# Patient Record
Sex: Male | Born: 1990 | Race: Black or African American | Hispanic: No | Marital: Single
Health system: Southern US, Community
[De-identification: ages and names within clinical notes are randomized; demographics above are authoritative.]

## PROBLEM LIST (undated history)

## (undated) DIAGNOSIS — F23 Brief psychotic disorder: Secondary | ICD-10-CM

## (undated) DIAGNOSIS — F209 Schizophrenia, unspecified: Secondary | ICD-10-CM

## (undated) DIAGNOSIS — F319 Bipolar disorder, unspecified: Secondary | ICD-10-CM

---
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
## 2012-01-21 ENCOUNTER — Emergency Department (HOSPITAL_COMMUNITY)
Admission: EM | Admit: 2012-01-21 | Discharge: 2012-01-21 | Disposition: A | Discharge status: Home / Self Care | Payer: Self-pay | Attending: Emergency Medicine | Admitting: Emergency Medicine

## 2012-01-21 ENCOUNTER — Emergency Department (HOSPITAL_COMMUNITY): Payer: Self-pay

## 2012-01-21 ENCOUNTER — Encounter (HOSPITAL_COMMUNITY): Payer: Self-pay | Admitting: *Deleted

## 2012-01-21 DIAGNOSIS — S0510XA Contusion of eyeball and orbital tissues, unspecified eye, initial encounter: Secondary | ICD-10-CM

## 2012-01-21 DIAGNOSIS — F17200 Nicotine dependence, unspecified, uncomplicated: Secondary | ICD-10-CM | POA: Insufficient documentation

## 2012-01-21 MED ORDER — IBUPROFEN 600 MG PO TABS: 600.0000 mg | ORAL_TABLET | Freq: Four times a day (QID) | ORAL | Status: AC | PRN

## 2012-01-21 NOTE — ED Notes (Signed)
Ice bag given.

## 2012-01-21 NOTE — ED Provider Notes (Signed)
Medical screening examination/treatment/procedure(s) were performed by non-physician practitioner and as supervising physician I was immediately available for consultation/collaboration.  Carlean Crowl, MD 01/21/12 0533 
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
## 2013-02-11 ENCOUNTER — Emergency Department (HOSPITAL_COMMUNITY)
Admission: EM | Admit: 2013-02-11 | Discharge: 2013-02-13 | Disposition: A | Discharge status: Other Acute Inpatient Hospital | Payer: Self-pay | Attending: Emergency Medicine | Admitting: Emergency Medicine

## 2013-02-11 ENCOUNTER — Encounter (HOSPITAL_COMMUNITY): Payer: Self-pay

## 2013-02-11 DIAGNOSIS — I82220 Acute embolism and thrombosis of inferior vena cava: Secondary | ICD-10-CM | POA: Insufficient documentation

## 2013-02-11 DIAGNOSIS — IMO0002 Reserved for concepts with insufficient information to code with codable children: Secondary | ICD-10-CM | POA: Insufficient documentation

## 2013-02-11 DIAGNOSIS — R443 Hallucinations, unspecified: Secondary | ICD-10-CM | POA: Insufficient documentation

## 2013-02-11 DIAGNOSIS — F17200 Nicotine dependence, unspecified, uncomplicated: Secondary | ICD-10-CM | POA: Insufficient documentation

## 2013-02-11 DIAGNOSIS — F201 Disorganized schizophrenia: Secondary | ICD-10-CM | POA: Insufficient documentation

## 2013-02-11 DIAGNOSIS — F29 Unspecified psychosis not due to a substance or known physiological condition: Secondary | ICD-10-CM | POA: Insufficient documentation

## 2013-02-11 LAB — COMPREHENSIVE METABOLIC PANEL
AST: 19 U/L (ref 0–37)
Albumin: 4.4 g/dL (ref 3.5–5.2)
Alkaline Phosphatase: 47 U/L (ref 39–117)
BUN: 9 mg/dL (ref 6–23)
CO2: 27 mEq/L (ref 19–32)
Chloride: 101 mEq/L (ref 96–112)
Potassium: 3.5 mEq/L (ref 3.5–5.1)
Total Bilirubin: 0.2 mg/dL — ABNORMAL LOW (ref 0.3–1.2)

## 2013-02-11 LAB — CBC WITH DIFFERENTIAL/PLATELET
Basophils Absolute: 0 10*3/uL (ref 0.0–0.1)
Basophils Relative: 0 % (ref 0–1)
Hemoglobin: 12.6 g/dL — ABNORMAL LOW (ref 13.0–17.0)
Lymphocytes Relative: 30 % (ref 12–46)
MCHC: 31.7 g/dL (ref 30.0–36.0)
Monocytes Relative: 9 % (ref 3–12)
Neutro Abs: 3.4 10*3/uL (ref 1.7–7.7)
Neutrophils Relative %: 60 % (ref 43–77)
WBC: 5.6 10*3/uL (ref 4.0–10.5)

## 2013-02-11 LAB — RAPID URINE DRUG SCREEN, HOSP PERFORMED
Barbiturates: NOT DETECTED
Tetrahydrocannabinol: POSITIVE — AB

## 2013-02-11 LAB — SALICYLATE LEVEL: Salicylate Lvl: <2 mg/dL — ABNORMAL LOW (ref 2.8–20.0)

## 2013-02-11 MED ORDER — ACETAMINOPHEN 325 MG PO TABS: 650.0000 mg | ORAL_TABLET | ORAL | Status: DC | PRN

## 2013-02-11 MED ORDER — LORAZEPAM 2 MG/ML IJ SOLN
2.0000 mg | Freq: Once | INTRAMUSCULAR | Status: DC
  Filled 2013-02-11: qty 1

## 2013-02-11 MED ORDER — ONDANSETRON HCL 4 MG PO TABS: 4.0000 mg | ORAL_TABLET | Freq: Three times a day (TID) | ORAL | Status: DC | PRN

## 2013-02-11 MED ORDER — LORAZEPAM 1 MG PO TABS: 1.0000 mg | ORAL_TABLET | Freq: Three times a day (TID) | ORAL | Status: DC | PRN

## 2013-02-11 MED ORDER — IBUPROFEN 200 MG PO TABS: 600.0000 mg | ORAL_TABLET | Freq: Three times a day (TID) | ORAL | Status: DC | PRN

## 2013-02-11 MED ORDER — LORAZEPAM 1 MG PO TABS
2.0000 mg | ORAL_TABLET | Freq: Once | ORAL | Status: DC
  Filled 2013-02-11: qty 2

## 2013-02-11 NOTE — ED Provider Notes (Signed)
CSN: [id number]     Arrival date & time 02/11/13  1635 History  This chart was scribed for non-physician practitioner Teressa Lower, FNP, working with Derwood Kaplan, MD, by Yevette Edwards, ED Scribe. This patient was seen in room [facility] and the patient's care was started at 5:10 PM.    First MD Initiated Contact with Patient 02/11/13 1645     Chief Complaint  Patient presents with  . IVC   . Medical Clearance    The history is provided by the patient, the police and a parent. No language interpreter was used.   HPI Comments: Mario [name] is a 22 y.o. male who presents to the Emergency Department due to involuntary commitment. The police report the pt had allegedly tried to OD on pills and had been hearing voices and conversing with them. However, at the ED, the pt denies both the pills and the voices. The pt's mother reports that the pt routinely attempts to OD on pills. The pt reports that he has not spoken with his mother in two weeks, and he expresses confusion about why and how his mother took out IVC papers on him. The pt denies being diagnosed with any psychological issues, behavioral issues, or any prior treatment by a behavioral health professional. He also denies attempting to harm himself or wanting to harm himself or others.   History reviewed. No pertinent past medical history. History reviewed. No pertinent past surgical history. No family history on file. History  Substance Use Topics  . Smoking status: Current Every Day Smoker -- 0.50 packs/day  . Smokeless tobacco: Not on file  . Alcohol Use: No     Comment: occasionally    Review of Systems  Constitutional: Negative for fever and chills.  Psychiatric/Behavioral: Positive for hallucinations (Per the pt's mother), behavioral problems (Per the pt's mother) and self-injury (Per the pt's mother).  All other systems reviewed and are negative.    Allergies  Review of patient's allergies indicates no known  allergies.  Home Medications  No current outpatient prescriptions on file.  Triage Vitals: BP 160/91  Pulse 112  Temp(Src) 98.4 F (36.9 C) (Oral)  Resp 18  SpO2 100%  Physical Exam  Nursing note and vitals reviewed. Constitutional: He is oriented to person, place, and time. He appears well-developed and well-nourished. No distress.  HENT:  Head: Normocephalic and atraumatic.  Eyes: EOM are normal.  Neck: Neck supple. No tracheal deviation present.  Cardiovascular: Normal rate.   Pulmonary/Chest: Effort normal. No respiratory distress.  Musculoskeletal: Normal range of motion.  Neurological: He is alert and oriented to person, place, and time.  Skin: Skin is warm and dry.  Psychiatric: He has a normal mood and affect. His behavior is normal.    ED Course   DIAGNOSTIC STUDIES: Oxygen Saturation is 100% on room air, normal by my interpretation.    COORDINATION OF CARE:  5:14 PM- Discussed treatment plan with patient, and the patient agreed to the plan.   Procedures (including critical care time)  Labs Reviewed  CBC WITH DIFFERENTIAL - Abnormal; Notable for the following:    Hemoglobin 12.6 (*)    All other components within normal limits  COMPREHENSIVE METABOLIC PANEL  URINE RAPID DRUG SCREEN (HOSP PERFORMED)  ACETAMINOPHEN LEVEL  SALICYLATE LEVEL   No results found. No diagnosis found.  MDM  Pt to be sent to psych ed for evaluation  I personally performed the services described in this documentation, which was scribed in my presence. The  recorded information has been reviewed and is accurate.    Teressa Lower, NP 02/11/13 2001

## 2013-02-11 NOTE — ED Notes (Signed)
Pt refused dinner tray

## 2013-02-11 NOTE — ED Notes (Signed)
Pt gave urine, greg at bedside assessing patient now

## 2013-02-11 NOTE — ED Notes (Addendum)
Notified Dondra Spry, NP. About pt behavior, agitation, not redirectable,  and noncompliant  NP request pt urune to further assess medical and psych needs.  New orders given by NP.  Pt refuses to give urine.  AC, [place] at nurses station talking with patient.  Will continue to monitor.
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
## 2013-02-11 NOTE — ED Notes (Signed)
Pt brought in by [employer] under IVC paperwork stating pt had tried OD on pills, but doesnt state when pt tried; pt hearing voices and talking back to them; and pt is threat to himself.  When triaging pt pt states he doesn't know why he is here bc the person who is doing all this calling he hasnt seen or spoken to in couple of weeks. Pt denying taking in any pills or any thoughts of wanting to harm himself or others at this time.

## 2013-02-11 NOTE — ED Notes (Signed)
Pt refuses to stay away from nurses station while in report.  Pt continues to ask the same questions, "Why am I here."  Per day shift [employer] and RN, "pt has been made aware over and over that he is IVCd and needs to be evaluated and pt has been explained what IVC means."  P.A., Rubin Payor made aware that patient is agitated and irritable.  New orders given.

## 2013-02-11 NOTE — ED Notes (Signed)
Pt at nurses station to demand a call to his mom.  Pt reports "I need to talk to her to see why she did this, why she so concerned and had me placed here."  Tammy Sours, counselor at nursing station making patient aware of their previous conversation about needing to stay for eval by md in morning.  Pt also made aware that counselor spoke with mom tonight.  Pt became very upset and said"I am not going to my room and I am not settling fort this!"  Pt advised to calm down and return to room as other patients try to rest.  Pt advised about meds ordered to assist him in calming down.  Pt reports "i am fine, i will go to my room"

## 2013-02-11 NOTE — ED Notes (Signed)
Pt up to the desk, increasingly  aggitated, asking when he is going to be able to leave, why he is here repeatedly. Pt redirected back to  His room w/ difficulty

## 2013-02-11 NOTE — ED Notes (Signed)
Very agitated and non compliant.  Pt trying to leave out of doors.  Security and [employer] talking with the patient.  Pt refusing to go to his room. Pt reports "I am not supposed to be here, this is all a mistake."  Pt made aware that a counselor or doctor needs to evaluate him.   Pt demands to speak with supervisor.  Wonda Amis, [facility] made aware and is in route to see patient.  [employer] and security present.

## 2013-02-11 NOTE — ED Notes (Signed)
Patient has three bags of belongings in locker 27. One Large book bag,one small book bag and one patient belonging bag.

## 2013-02-12 DIAGNOSIS — F29 Unspecified psychosis not due to a substance or known physiological condition: Secondary | ICD-10-CM | POA: Clinically undetermined

## 2013-02-12 MED ORDER — ZIPRASIDONE HCL 20 MG PO CAPS
20.0000 mg | ORAL_CAPSULE | Freq: Two times a day (BID) | ORAL | Status: DC
  Filled 2013-02-12: qty 1

## 2013-02-12 MED ORDER — ZIPRASIDONE MESYLATE 20 MG IM SOLR
INTRAMUSCULAR | Status: AC
  Administered 2013-02-12: 20 mg via INTRAMUSCULAR
  Filled 2013-02-12: qty 20

## 2013-02-12 MED ORDER — ZIPRASIDONE HCL 20 MG PO CAPS: 20.0000 mg | ORAL_CAPSULE | Freq: Once | ORAL | Status: DC

## 2013-02-12 MED ORDER — ZIPRASIDONE HCL 20 MG PO CAPS
20.0000 mg | ORAL_CAPSULE | Freq: Two times a day (BID) | ORAL | Status: DC
  Administered 2013-02-13: 20 mg via ORAL
  Filled 2013-02-12: qty 1

## 2013-02-12 MED ORDER — ZIPRASIDONE MESYLATE 20 MG IM SOLR: 20.0000 mg | Freq: Once | INTRAMUSCULAR | Status: AC

## 2013-02-12 NOTE — ED Notes (Signed)
Pt increasingly aggitated, wanted to leave, reporting he isn't going to stay.  Pt refusing medications, asking repeative questions.  Dr Lucianne Muss aware (pt at N.Station) dr Lucianne Muss present.  Pt  Talking/rambling is unable to understand why the Dr's want him to take medications or be admitted.

## 2013-02-12 NOTE — ED Notes (Signed)
Up to the bathroom 

## 2013-02-12 NOTE — ED Notes (Signed)
Up to the desk on the phone 

## 2013-02-12 NOTE — ED Notes (Signed)
Mother at front desk, very upset, inquiring why she is unable to see her son at this time. Staff explained to pt that her son is sedated and sleeping at this time.  Mother spoke with [facility] AC Wonda Amis, and RN Samiel Peel and Medical laboratory scientific officer.  Mother at bedside to see pt.  Grandmother at bedside to see pt.

## 2013-02-12 NOTE — ED Notes (Signed)
Start Geodon now per Dr Lucianne Muss

## 2013-02-12 NOTE — ED Provider Notes (Signed)
1:16 PM  Pt is a 22 year old schizophrenic male who is being involuntarily committed by psychiatry given his psychosis, delusions, hallucinations and suicidal ideation.  Patient becoming increasingly agitated and refusing to take oral medication. Given he is a threat to himself and others, agree with Dr. Lucianne Muss with psychiatry the patient requires IM antipsychotics for sedation.  Layla Maw Brinlyn Cena, DO 02/12/13 1317

## 2013-02-12 NOTE — ED Notes (Addendum)
Up to the desk,wanting to leave.  Pt is aware and was reminded that  he has to see the psychaitrist for evaluation and that he is under IVC.  Pt upset about having to stay, but returned to his room

## 2013-02-12 NOTE — ED Notes (Signed)
Quietly, waiting for his mom to arrive and the MDtosee

## 2013-02-12 NOTE — ED Notes (Addendum)
Pt medicated w/ security assist,  Pt remains talkative/argumentative,  Wanting to leave, IVC again explained to the pt. Security remains w/ pt.

## 2013-02-12 NOTE — ED Notes (Signed)
Pt up in hall, and to desk, declining to return to his room, talkative/rambling/wanting to leave asking repeative questions.   Up to the unit doors and trying to open them as well as door to the nurses station Pt reports he is not going to eat or drink anything while he is here.  Pt argumentative/talative, but was able to return to his room.

## 2013-02-12 NOTE — ED Notes (Signed)
Pt's grandmother into see 
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
## 2013-02-12 NOTE — ED Notes (Signed)
Up  To the desk on the phone
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
## 2013-02-12 NOTE — ED Notes (Signed)
Shuvon NP into see 

## 2013-02-12 NOTE — ED Notes (Addendum)
Shuvon NP talked w/ patients mother by phone concerning plan.  May delay EKG until 2000 and give geodon w/ sandwich at 8:00p per [facility] NP

## 2013-02-12 NOTE — ED Notes (Signed)
Dr Lucianne Muss and EDP into see

## 2013-02-12 NOTE — ED Notes (Signed)
Dr Lucianne Muss talking w/mom

## 2013-02-12 NOTE — ED Notes (Signed)
Patients mother here talking w/ Denice Bors NP

## 2013-02-12 NOTE — ED Notes (Signed)
Pt repeatedly declined to take PO medication, IM ordered, security into assist.  Pt continues to be talkative/rambling.

## 2013-02-12 NOTE — ED Notes (Signed)
Dr Kumar and Shuvon NP into see 

## 2013-02-12 NOTE — ED Notes (Addendum)
Up to the desk asking when the MD will be here, pt declined to eat breakfast ,and declined PO's, declined to shower.

## 2013-02-13 ENCOUNTER — Encounter (HOSPITAL_COMMUNITY): Payer: Self-pay | Admitting: Behavioral Health

## 2013-02-13 ENCOUNTER — Inpatient Hospital Stay (HOSPITAL_COMMUNITY)
Admission: AD | Admit: 2013-02-13 | Discharge: 2013-02-17 | DRG: 885 | Disposition: A | Discharge status: Home / Self Care | Payer: No Typology Code available for payment source | Source: Intra-hospital | Attending: Psychiatry | Admitting: Psychiatry

## 2013-02-13 DIAGNOSIS — F201 Disorganized schizophrenia: Secondary | ICD-10-CM

## 2013-02-13 DIAGNOSIS — Z79899 Other long term (current) drug therapy: Secondary | ICD-10-CM

## 2013-02-13 DIAGNOSIS — F1210 Cannabis abuse, uncomplicated: Secondary | ICD-10-CM | POA: Diagnosis present

## 2013-02-13 DIAGNOSIS — F29 Unspecified psychosis not due to a substance or known physiological condition: Principal | ICD-10-CM | POA: Diagnosis present

## 2013-02-13 DIAGNOSIS — F39 Unspecified mood [affective] disorder: Secondary | ICD-10-CM | POA: Diagnosis present

## 2013-02-13 MED ORDER — ALUM & MAG HYDROXIDE-SIMETH 200-200-20 MG/5ML PO SUSP: 30.0000 mL | ORAL | Status: DC | PRN

## 2013-02-13 MED ORDER — HYDROXYZINE HCL 25 MG PO TABS
25.0000 mg | ORAL_TABLET | Freq: Every evening | ORAL | Status: DC | PRN
  Filled 2013-02-13: qty 14

## 2013-02-13 MED ORDER — MAGNESIUM HYDROXIDE 400 MG/5ML PO SUSP: 30.0000 mL | Freq: Every day | ORAL | Status: DC | PRN

## 2013-02-13 MED ORDER — ZIPRASIDONE HCL 20 MG PO CAPS
20.0000 mg | ORAL_CAPSULE | Freq: Two times a day (BID) | ORAL | Status: DC
  Administered 2013-02-13 – 2013-02-14 (×2): 20 mg via ORAL
  Filled 2013-02-13 (×4): qty 1

## 2013-02-13 MED ORDER — ACETAMINOPHEN 325 MG PO TABS: 650.0000 mg | ORAL_TABLET | Freq: Four times a day (QID) | ORAL | Status: DC | PRN

## 2013-02-13 NOTE — ED Notes (Signed)
Mother here to visit. 

## 2013-02-13 NOTE — Progress Notes (Signed)
[facility] CL did not get to see patient but will be sending him information about the [facility] Orange Card program, using the address provided.  °

## 2013-02-13 NOTE — [facility] Counselor (Signed)
Writer contacted counselor (Toyka) at [facility] and was informed that there are not any beds for adults open.  Writer will refer the patient to [facility], [facility] and [facility].    

## 2013-02-13 NOTE — Progress Notes (Signed)
Pt encountered chaplain at nursing station.   Exhibited anxiousness and asked repetitive questions around discharge.  Asking to speak with MD, NP, and asking if chaplain had any influence over care plan.  Chaplain explained spiritual care and oriented pt to [facility], encouraging him to speak with his nurse re: schedule to see MD or MSW.

## 2013-02-13 NOTE — Tx Team (Signed)
Initial Interdisciplinary Treatment Plan  PATIENT STRENGTHS: (choose at least two) Ability for insight Active sense of humor Work skills  PATIENT STRESSORS: Educational concerns Financial difficulties   PROBLEM LIST: Problem List/Patient Goals Date to be addressed Date deferred Reason deferred Estimated date of resolution  Psychosis  02/13/13     SI 02/13/13                                                DISCHARGE CRITERIA:  Ability to meet basic life and health needs Adequate post-discharge living arrangements Improved stabilization in mood, thinking, and/or behavior Verbal commitment to aftercare and medication compliance  PRELIMINARY DISCHARGE PLAN: Attend aftercare/continuing care group Attend PHP/IOP  PATIENT/FAMIILY INVOLVEMENT: This treatment plan has been presented to and reviewed with the patient, Mario [name], and/or family member.  The patient and family have been given the opportunity to ask questions and make suggestions.  Jelisa  L 02/13/2013, 3:51 PM

## 2013-02-13 NOTE — [facility] Counselor (Signed)
Writer was informed by the [facility] that the patient has been accepted to [facility] and the accepting doctor is Dr. Lucianne Muss.  The patient will be placed in bed 405-2.    Writer completed support paperwork that included the IVC paperwork for the patient.  Writer faxed this information [facility].  Writer gave the support paperwork to the nurse Silvio Pate).

## 2013-02-13 NOTE — Progress Notes (Signed)
D: Patient in bed trying to sleep during this assessment. He was very loud and agitated earlier, but his mother and the [facility] spent a lot of time talking to him and calming him down He was somewhat calm and not agitated when I assessed him. Although he answered  Most question with yes/no.  A: Writer encouraged and supported patient.  R: Patient receptive to encouragement and support.

## 2013-02-13 NOTE — Consult Note (Signed)
Patient evaluated, plan done by me. Pt's IVC is continued

## 2013-02-13 NOTE — ED Notes (Signed)
Report called to [facility] RN/ [employer] to transport to [facility] from [facility] ED.

## 2013-02-13 NOTE — Progress Notes (Signed)
Admission note: Pt denies SI/HI/AVH. Pt has no insight for treatment. Pt continues to request to be discharge today. Pt stated that the NP over in the psych ED told him that he could go home once he talked to the NP here at [facility].  Pt explained that policy here at [facility] and was told that he would be evaluated by the MD on this unit the following day. Pt continues to request to speak to the NP about being discharge today. NP made aware. Writer explained to pt several times that he will not be discharge today.  Pt is mildly confused and upset at this time. Pt unable to understand the policy here at [facility] because he is fixated on leaving today.   Pt assessed, belonging searched and pt explained policy here at [facility]. Pt safe on the unit.
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
## 2013-02-14 DIAGNOSIS — F29 Unspecified psychosis not due to a substance or known physiological condition: Principal | ICD-10-CM

## 2013-02-14 DIAGNOSIS — F1210 Cannabis abuse, uncomplicated: Secondary | ICD-10-CM | POA: Diagnosis present

## 2013-02-14 LAB — TSH: TSH: 1.553 u[IU]/mL (ref 0.350–4.500)

## 2013-02-14 MED ORDER — BENZTROPINE MESYLATE 0.5 MG PO TABS
0.5000 mg | ORAL_TABLET | Freq: Every day | ORAL | Status: DC
  Administered 2013-02-14 – 2013-02-15 (×2): 0.5 mg via ORAL
  Filled 2013-02-14 (×3): qty 1

## 2013-02-14 MED ORDER — FLUPHENAZINE HCL 5 MG PO TABS
5.0000 mg | ORAL_TABLET | Freq: Every day | ORAL | Status: DC
  Administered 2013-02-14 – 2013-02-15 (×2): 5 mg via ORAL
  Filled 2013-02-14 (×3): qty 1

## 2013-02-14 NOTE — [facility] Group Notes (Signed)
[facility] LCSW Group Therapy  02/14/2013 , 12:36 PM   Type of Therapy:  Group Therapy  Participation Level:  Did not attend    Summary of Progress/Problems: Today's group focused on the term Diagnosis.  Participants were asked to define the term, and then pronounce whether it is a negative, positive or neutral term.    Mario [name] B 02/14/2013 , 12:36 PM
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
## 2013-02-14 NOTE — Tx Team (Signed)
  Interdisciplinary Treatment Plan Update   Date Reviewed:  02/14/2013  Time Reviewed:  7:58 AM  Progress in Treatment:   Attending groups: Yes Participating in groups: Yes Taking medication as prescribed: Yes  Tolerating medication: Yes Family/Significant other contact made: No Patient understands diagnosis: No  Limited insight  Denies problems Discussing patient identified problems/goals with staff: Yes Medical problems stabilized or resolved: Yes Denies suicidal/homicidal ideation: Yes  In tx team Patient has not harmed self or others: Yes  For review of initial/current patient goals, please see plan of care.  Estimated Length of Stay:  4-5 days  Reason for Continuation of Hospitalization: Delusions  Medication stabilization  New Problems/Goals identified:  N/A  Discharge Plan or Barriers:   Hopes to return to mother's home,  Follow up outpt    Additional Comments:  Mario [name] is an 22 y.o. male. Pt brought to [facility] under IVC taken out by his mother, Mario [name] of [place]. [phone]. IVC petition reports pt is suicidal, tried to overdose, is hearing voices and talking back to them, is threatening to kill his family, and is setting things on fire. Pt denies all these things. Pt has been very agitated and at the nurses station wanting to leave for some time. Upon assessment, pt presents as calm and answers questions appropriately, although somewhat evasively. Pt denies SI/HI/AV. Pt denies any recent contact with his mother in the past 2 weeks. Pt reports he was evaluated at [facility] somewhat recently but denies any other treatment. Mario [name], the mother/petitioner was contacted. She reports that pt has demonstrated some strange behavior over the past 2 years but not to the extent that she was truly worried until the past 2 weeks. This AM, Mario [name] was called by her stepfather and sister saying that pt was found sleeping on the stepfather's porch this AM and something needed to be  done. Pt has done this several times recently, has not been bathing regularly or even changing clothes for over a week at a time   Attendees:  Signature: Thedore Mins, MD 02/14/2013 7:58 AM   Signature: Richelle Ito, LCSW 02/14/2013 7:58 AM  Signature: Fransisca Kaufmann, NP 02/14/2013 7:58 AM  Signature: Joslyn Devon, RN 02/14/2013 7:58 AM  Signature: Liborio Nixon, RN 02/14/2013 7:58 AM  Signature:  02/14/2013 7:58 AM  Signature:   02/14/2013 7:58 AM  Signature:    Signature:    Signature:    Signature:    Signature:    Signature:      Scribe for Treatment Team:   Richelle Ito, LCSW  02/14/2013 7:58 AM

## 2013-02-14 NOTE — Clinical Social Work Note (Signed)
Spoke with mother.  Main concerns were lack of care for self, talking to himself, anger.  In the past he has used "lean" and "syrup"-street drugs that seemed to increase symptoms.  He has been unwilling to get help, and their fear is that someone will hurt him because of his poor decisions and impulsiveness [ie sleeping on random porches].  She is agreeable to forced medication "if it is a last resort, because he do really need help, and he don't think he do."

## 2013-02-14 NOTE — Treatment Plan (Signed)
Late entry for 8/11 1900:    Met with pt and his mother.  Upon entering entering room pt and mother were arguing about pts need for hospitalization.  Earlier in the afternoon writer had had a similar conversation with pt that nearly necessitated a physical intervention due to pt refusal to relinquish a pen that was given to him so that he may "write an official letter."  After pt wrote a letter that basically implied he had been lied to by staff regarding access to a MD he proceeded to ask every staff he could find to sign the letter implicated them in the lie.  Obviously no staff was willing to sign which caused pt to become increasingly agitated and as a result required well over an hour of verbal de-escalation and attempts by staff to process with him which were unsuccessful due to pt's hypervigilance and ruminations involving what he thought he had been told by a NP in the ED at [facility].    Pt's mother tried unsuccessfully to remind the pt of what was said in the ED and his perception of events and conversations were essentially distorted and that he heard "only what he wanted to hear" and not what was actually being said.  Pt repeatedly stated, "that isn't true" and would proceed to repeat what he thought he had been told over and over again, which mother would then refute and the circular argument would continue with both parties becoming frustrated with the other.  Essentially ptdoes not think he needs to be under evaluation; that nothing "mental" is wrong with him, while mother feels like pt's behaviors have been increasingly erratic and unpredictable.  Pt has an explaination for all of mother's concern's which are minimizations of the actual behaviors.  For example, he is not sleeping on people's porchs; he is camping out.  He is not lighting fires; rather he is cooking outside.    Pt's speech remains rapid, pressured, and tangential at times. He is, however, articulate but hypervigilant and demanding.    Mother reports a history of bipolar disorder on both sides of the family.  Pt has zero insight into any potential mental illness and feels as though he does not need help.

## 2013-02-14 NOTE — BHH Counselor (Signed)
Adult Comprehensive Assessment  Patient ID: Mario [name], male   DOB: 1990-10-06, 22 y.o.   MRN: [id number]  Information Source: Information source: Patient  Current Stressors:  Educational / Learning stressors: Yes  Unable to finish semester at [employer] / Job issues: Yes  Sporadic work with lawn care Family Relationships: Yes  Family will not let him stay with them without paying rent Financial / Lack of resources (include bankruptcy): Yess Little income Housing / Lack of housing: Yes  Homeless-been staying in a tent on grandmother's ex-husband's property Physical health (include injuries & life threatening diseases): N/A Social relationships: N/A Substance abuse: N/A Bereavement / Loss: N/A  Living/Environment/Situation:  Living Arrangements: Other relatives Living conditions (as described by patient or guardian): Tent with an electric cord for a fan How long has patient lived in current situation?: couple of weeks What is atmosphere in current home: Comfortable  Family History:  Marital status: Single Does patient have children?: No  Childhood History:  By whom was/is the patient raised?: Mother Additional childhood history information: father in and out-no real relationship Description of patient's relationship with caregiver when they were a child: good Patient's description of current relationship with people who raised him/her: OK-hope she let's me return there after hospital since I am here Does patient have siblings?: Yes Number of Siblings: 7 Description of patient's current relationship with siblings: all half  youngest is 3 Did patient suffer any verbal/emotional/physical/sexual abuse as a child?: No Did patient suffer from severe childhood neglect?: No Has patient ever been sexually abused/assaulted/raped as an adolescent or adult?: No Was the patient ever a victim of a crime or a disaster?: No Witnessed domestic violence?: No Has patient been effected  by domestic violence as an adult?: No  Education:  Highest grade of school patient has completed: 12 plus Currently a student?: No Learning disability?: No  Employment/Work Situation:   Employment situation: Unemployed Patient's job has been impacted by current illness: No What is the longest time patient has a held a job?: 1 yr Where was the patient employed at that time?: part time at [employer] Has patient ever been in the [employer]?: No Has patient ever served in Buyer, retail?: No  Financial Resources:   Financial resources: No income Does patient have a Lawyer or guardian?: No  Alcohol/Substance Abuse:   What has been your use of drugs/alcohol within the last 12 months?: "social" marijuana use Alcohol/Substance Abuse Treatment Hx: Denies past history Has alcohol/substance abuse ever caused legal problems?: Yes (arrest for marijuana posession-misdeameanor-no jail time)  Social Support System:   Patient's Community Support System: Fair Museum/gallery exhibitions officer System: family, friends Type of faith/religion: N/A How does patient's faith help to cope with current illness?: N/A  Leisure/Recreation:   Leisure and Hobbies: lawn care, TV, hang out with friends  Strengths/Needs:   What things does the patient do well?: write, communicate In what areas does patient struggle / problems for patient: singing, dancing  Discharge Plan:   Does patient have access to transportation?: Yes Will patient be returning to same living situation after discharge?: No Plan for living situation after discharge: hopes his mother will allow him to return home Currently receiving community mental health services: No If no, would patient like referral for services when discharged?: Yes (What county[place] Medical sales [place]) Does patient have financial barriers related to discharge medications?: Yes Patient description of barriers related to discharge medications: no income, no  insurance  Summary/Recommendations:   Summary and Recommendations (to be  completed by the evaluator): [name] is a 22 YO AA male who has no previous mental health history and is hospitalized for behaviorts that concern his mother, including sleeping on people's prches.  He explains this by saying he is homeless and he likes "camping."  States he was enrolled at [employer] but was suspended "because I was writing 10 page papers for a 2 paragraph assignment, and I think they did not like the one I wrote about the government."  He can benefit from crises stabilization, medication managment, therapeutic milieu and referral for services.  Daryel Gerald B. 02/14/2013

## 2013-02-14 NOTE — BHH Suicide Risk Assessment (Signed)
Suicide Risk Assessment  Admission Assessment     Nursing information obtained from:  Patient Demographic factors:  Male;Adolescent or young adult;Low socioeconomic status Current Mental Status:  Suicidal ideation indicated by patient;Suicidal ideation indicated by others;Suicide plan;Self-harm thoughts Loss Factors:  Financial problems / change in socioeconomic status Historical Factors:  NA Risk Reduction Factors:  Sense of responsibility to family;Employed  CLINICAL FACTORS:   Currently Psychotic  COGNITIVE FEATURES THAT CONTRIBUTE TO RISK:  Closed-mindedness Polarized thinking    SUICIDE RISK:   Minimal: No identifiable suicidal ideation.  Patients presenting with no risk factors but with morbid ruminations; may be classified as minimal risk based on the severity of the depressive symptoms  PLAN OF CARE:1. Admit for crisis management and stabilization. 2. Medication management to reduce current symptoms to base line and improve the     patient's overall level of functioning 3. Treat health problems as indicated. 4. Develop treatment plan to decrease risk of relapse upon discharge and the need for     readmission. 5. Psycho-social education regarding relapse prevention and self care. 6. Health care follow up as needed for medical problems. 7. Restart home medications where appropriate.   I certify that inpatient services furnished can reasonably be expected to improve the patient's condition.  Luverne Farone,MD 02/14/2013, 10:45 AM

## 2013-02-14 NOTE — Progress Notes (Addendum)
D:Patient in day room at the beginning of the shift. He stated ;" "everything is good". Thought process tangential and speech rapid. Patient mood and affect blunted and depressed. He denied SI/HI and denied hallucinations. 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 A: Writer encouraged and supported patient. R: Patient receptive to encouragement and supports. Q 15 minute checks continues as ordered to maintain safety.

## 2013-02-14 NOTE — Progress Notes (Signed)
Pt denies SI/HI/AVH. Pt has no insight for treatment. Pt presents with flat affect. Anxious mood. Pt has rapid pressured speech. Flight of ideas. 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 Pt is having difficulty processing information and continues to ask repetitive questions. During medication administration, pt tried to hold med in his hand and pretend that he took the med. Writer asked pt to open his hand, pt then attempted to take the med quickly but then reached into his back pocket. Writer searched pt pants for med and also assessed for cheeking. A: Medications administered as ordered per MD. Verbal support given. Pt encouraged to attend groups. 15 minute checks performed for safety. R: Pt remains safe on the unit.

## 2013-02-15 DIAGNOSIS — F1210 Cannabis abuse, uncomplicated: Secondary | ICD-10-CM

## 2013-02-15 NOTE — Progress Notes (Signed)
D: Pt denies SI/HI/AVH. Pt presents with rapid pressure speech. Flight of ideas. No insight for treatment. Pt thoughts are disorganized. Pt have an excuse for every abnormal behavior that concerns his mother. Pt reports that he has not been acting out so he's not sure why his mother is making up things. A: Medications administered as ordered per MD. Verbal support given. Pt encouraged to attend groups. 15 minute checks performed for safety. R: Pt has minimal interaction on the milieu. Pt is paranoid and believes that his family is against him.

## 2013-02-15 NOTE — Progress Notes (Signed)
The focus of this group is to help patients review their daily goal of treatment and discuss progress on daily workbooks. Pt attended the evening group session and was polite but responded minimally to discussion prompts from the Writer. Pt reported having a good day but did not wish to elaborate on it beyond saying that it was good. Pt's affect was flat and he did not appear engaged in the group.

## 2013-02-15 NOTE — Progress Notes (Signed)
Pt refusing to eat meals and also refusing to drink fluids. Pt stated that he is fasting/training for [employer] basketball. Pt encouraged to eat fruits, vegetables and to increase fluid intake. Pt stated, "I have the right not to eat or drink".

## 2013-02-15 NOTE — Progress Notes (Signed)
[facility] MD Progress Note  02/15/2013 11:05 AM Mario [name]  MRN:  [id number] Subjective: " I don't know why my mother put me here, I did not do anything wrong." Objective: Patient denies having any symptoms and has no ideas why he is admitted to the hospital. He has been observed to be argumentative, hyperverbal and talking to himself. His mother reports that he has been smoking Marijuana and doing drugs called "Lean and syrup" which has "messed" him up. She says, patient has been behaving erratically and bizarre. He has been sleeping on peoples' porches and she is afraid someone might hurt him. Patient has no insight into his problem. He admitted smoking Marijuana but denies doing any other drugs. Diagnosis:  Axis I:Psychotic disorder                               Cannabis use disorder   ADL's:  Intact  Sleep: Fair  Appetite:  Fair  Suicidal Ideation: denies  Homicidal Ideation: denies  AEB (as evidenced by):  Psychiatric Specialty Exam: Review of Systems  Constitutional: Negative.   HENT: Negative.   Eyes: Negative.   Respiratory: Negative.   Cardiovascular: Negative.   Gastrointestinal: Negative.   Genitourinary: Negative.   Musculoskeletal: Negative.   Skin: Negative.   Neurological: Negative.   Endo/Heme/Allergies: Negative.   Psychiatric/Behavioral: Positive for hallucinations and substance abuse. The patient is nervous/anxious and has insomnia.     Blood pressure 139/90, pulse 83, temperature 97.3 F (36.3 C), temperature source Oral, resp. rate 18, height 5' 6.14" (1.68 m), weight 71.668 kg (158 lb).Body mass index is 25.39 kg/(m^2).  General Appearance: Fairly Groomed  Patent attorney::  Fair  Speech:  Pressured and rapid  Volume:  Normal  Mood:  Dysphoric  Affect:  Blunt  Thought Process:  Disorganized  Orientation:  Full (Time, Place, and Person)  Thought Content:  Delusions and Rumination  Suicidal Thoughts:  No  Homicidal Thoughts:  No  Memory:  Immediate;    Fair Recent;   Fair Remote;   Fair  Judgement:  Poor  Insight:  Lacking  Psychomotor Activity:  Normal  Concentration:  Fair  Recall:  Fair  Akathisia:  No  Handed:  Right  AIMS (if indicated):     Assets:  Communication Skills Desire for Improvement Social Support Others:  family support  Sleep:  Number of Hours: 6.75   Current Medications: Current Facility-Administered Medications  Medication Dose Route Frequency Provider Last Rate Last Dose  . acetaminophen (TYLENOL) tablet 650 mg  650 mg Oral Q6H PRN Earney Navy, NP      . alum & mag hydroxide-simeth (MAALOX/MYLANTA) 200-200-20 MG/5ML suspension 30 mL  30 mL Oral Q4H PRN Earney Navy, NP      . benztropine (COGENTIN) tablet 0.5 mg  0.5 mg Oral QHS Monigue Spraggins   0.5 mg at 02/14/13 2134  . fluPHENAZine (PROLIXIN) tablet 5 mg  5 mg Oral QHS Keen Ewalt   5 mg at 02/14/13 2134  . hydrOXYzine (ATARAX/VISTARIL) tablet 25 mg  25 mg Oral QHS PRN Earney Navy, NP      . magnesium hydroxide (MILK OF MAGNESIA) suspension 30 mL  30 mL Oral Daily PRN Earney Navy, NP        Lab Results:  Results for orders placed during the hospital encounter of 02/13/13 (from the past 48 hour(s))  TSH     Status: None  Collection Time    02/13/13  7:35 PM      Result Value Range   TSH 1.553  0.350 - 4.500 uIU/mL   Comment: Performed at [facility]    Physical Findings: AIMS: Facial and Oral Movements Muscles of Facial Expression: None, normal Lips and Perioral Area: None, normal Jaw: None, normal Tongue: None, normal,Extremity Movements Lower (legs, knees, ankles, toes): None, normal, Trunk Movements Neck, shoulders, hips: None, normal, Overall Severity Severity of abnormal movements (highest score from questions above): None, normal Incapacitation due to abnormal movements: None, normal Patient's awareness of abnormal movements (rate only patient's report): No Awareness, Dental Status Current  problems with teeth and/or dentures?: No Does patient usually wear dentures?: No  CIWA:    COWS:     Treatment Plan Summary: Daily contact with patient to assess and evaluate symptoms and progress in treatment Medication management  Plan:1. Admit for crisis management and stabilization. 2. Medication management to reduce current symptoms to base line and improve the     patient's overall level of functioning 3. Treat health problems as indicated. 4. Develop treatment plan to decrease risk of relapse upon discharge and the need for     readmission. 5. Psycho-social education regarding relapse prevention and self care. 6. Health care follow up as needed for medical problems. 7. Restart home medications where appropriate.   Medical Decision Making Problem Points:  Established problem, stable/improving (1), Review of last therapy session (1) and Review of psycho-social stressors (1) Data Points:  Order Aims Assessment (2) Review of medication regiment & side effects (2) Review of new medications or change in dosage (2)  I certify that inpatient services furnished can reasonably be expected to improve the patient's condition.   Overton Boggus,MD 02/15/2013, 11:05 AM

## 2013-02-15 NOTE — BHH Group Notes (Signed)
[facility] LCSW Aftercare Discharge Planning Group Note   02/15/2013 1:53 PM  Participation Quality:  Minimal  Mood/Affect:  Flat  Depression Rating:  denies  Anxiety Rating:  denies  Thoughts of Suicide:  No Will you contract for safety?   NA  Current AVH:  No  Plan for Discharge/Comments:  Attended with his sheaf of writing papers.  Took notes during group.  States he took the meds as ordered last night with no discernable side affects.  Reassured he would see the Dr today so he could get his questions answered.  Transportation Means: mother  Supports:mother  [place], [place] B

## 2013-02-15 NOTE — BHH Group Notes (Signed)
[facility] [employer] Group Therapy  02/15/2013 , 1:58 PM    Type of Therapy:  [facility] [employer] Presentation  Participation Level:  Active  Participation Quality:  Attentive  Affect:  Blunted  Cognitive:  Oriented  Insight:  Limited  Engagement in Therapy:  Engaged  Modes of Intervention:  Discussion, Education and Socialization  Summary of Progress/Problems:  Onalee Hua from [employer] came to present his recovery story and play the guitar.  Sat quietly through the presentation.  No questions.   Daryel Gerald B 02/15/2013 , 1:58 PM

## 2013-02-15 NOTE — BHH Group Notes (Signed)
Adult Psychoeducational Group Note  Date:  02/15/2013 Time:  3:51 AM  Group Topic/Focus:  Wrap-Up Group:   The focus of this group is to help patients review their daily goal of treatment and discuss progress on daily workbooks.  Participation Level:  Minimal  Participation Quality:  Appropriate  Affect:  Appropriate  Cognitive:  Appropriate  Insight: Limited  Engagement in Group:  Limited  Modes of Intervention:  Discussion  Additional Comments:  Miloh stated that he had a good day.  Everything worked out for him.  He talked to the Physician's Assistant and the doctor.  He also expressed he went to groups, outside and to the gym to play basketball.  He also had visitation with family members.  Caroll Rancher A 02/15/2013, 3:51 AM

## 2013-02-15 NOTE — Progress Notes (Signed)
Recreation Therapy Notes  Date: 08.13.2014 Time: 9:30am Location: 400 Hall Dayroom Group Topic: Leisure Education  Goal Area(s) Addresses:  Patient will verbalize activity of interest by end of group session. Patient will verbalize the ability to use positive leisure/recreation as a coping mechanism.  Behavioral Response: Engaged, Appropriate   Intervention: Adapted Game  Activity: Letters of Leisure. Patients were asked to select a card with a letter of the alphabet from LRT. Patients were asked to state a leisure/recreation activity of choice to correspond with the letter chosen. After each patient had chosen a card and identified an activity group members were asked to identify letters not selected individually.   Education:  Leisure Education, Pharmacologist, Discharge Planning  Education Outcome: Acknowledges understanding  Clinical Observations/Feedback:  Patient presented with blunted affect, but actively participated in session. Patient arrived to group session approximately five minutes late. When patient arrived patient apologized for being late, explaining he had to take care of his personal hygiene. Patient verbalized understanding of group topic and engaged in activity. Patient selected an card from LRT and stated an appropriate activity to correspond with letter selected. Patient additionally spontaneously contributed to group list, suggesting appropriate activities for group list.   Jearl Klinefelter, LRT/CTRS  Jearl Klinefelter 02/15/2013 12:54 PM

## 2013-02-15 NOTE — Progress Notes (Signed)
Pt is currently denying the need for inpatient psychiatric treatment. He believes that he just needs someone to talk to as a solution. Pt repeatedly reports that he is just ready to go home. Pt is denying any SI/HI/AVH. Pt presented with a flat affect this evening. Pt observed with minimal interaction this evening. Pt was informed of medication indications. Sheets provided from [facility] Nursing consult.  A: Writer administered scheduled medications to pt. Continued support and availability as needed was extended to this pt. Staff continue to monitor pt with q8min checks.  R: No adverse drug reactions noted. Pt receptive to treatment. Pt remains safe at this time.

## 2013-02-16 DIAGNOSIS — F39 Unspecified mood [affective] disorder: Secondary | ICD-10-CM | POA: Diagnosis present

## 2013-02-16 MED ORDER — FLUPHENAZINE HCL 5 MG PO TABS
5.0000 mg | ORAL_TABLET | Freq: Two times a day (BID) | ORAL | Status: DC
  Administered 2013-02-16 – 2013-02-17 (×2): 5 mg via ORAL
  Filled 2013-02-16 (×3): qty 1
  Filled 2013-02-16: qty 28
  Filled 2013-02-16: qty 1
  Filled 2013-02-16: qty 28

## 2013-02-16 MED ORDER — OLANZAPINE 10 MG PO TBDP: 10.0000 mg | ORAL_TABLET | Freq: Three times a day (TID) | ORAL | Status: DC | PRN

## 2013-02-16 MED ORDER — BENZTROPINE MESYLATE 0.5 MG PO TABS
0.5000 mg | ORAL_TABLET | Freq: Two times a day (BID) | ORAL | Status: DC
  Administered 2013-02-16 – 2013-02-17 (×2): 0.5 mg via ORAL
  Filled 2013-02-16 (×2): qty 1
  Filled 2013-02-16: qty 28
  Filled 2013-02-16: qty 1
  Filled 2013-02-16: qty 28
  Filled 2013-02-16: qty 1

## 2013-02-16 MED ORDER — CARBAMAZEPINE 200 MG PO TABS
200.0000 mg | ORAL_TABLET | Freq: Two times a day (BID) | ORAL | Status: DC
  Administered 2013-02-16 – 2013-02-17 (×2): 200 mg via ORAL
  Filled 2013-02-16 (×3): qty 1
  Filled 2013-02-16: qty 28
  Filled 2013-02-16: qty 1
  Filled 2013-02-16: qty 28

## 2013-02-16 NOTE — Tx Team (Signed)
  Interdisciplinary Treatment Plan Update   Date Reviewed:  02/16/2013  Time Reviewed:  4:05 PM  Progress in Treatment:   Attending groups: Yes Participating in groups: Yes Taking medication as prescribed: Yes  Tolerating medication: Yes Family/Significant other contact made: Yes  Patient understands diagnosis: Yes  Discussing patient identified problems/goals with staff: Yes Medical problems stabilized or resolved: Yes Denies suicidal/homicidal ideation: Yes Patient has not harmed self or others: Yes  For review of initial/current patient goals, please see plan of care.  Estimated Length of Stay:  D/C tomorrow  Reason for Continuation of Hospitalization:   New Problems/Goals identified:  N/A  Discharge Plan or Barriers:   return to mother's home, follow up outpt  Additional Comments:  Attendees:  Signature: Thedore Mins, MD 02/16/2013 4:05 PM   Signature: Richelle Ito, LCSW 02/16/2013 4:05 PM  Signature: Fransisca Kaufmann, NP 02/16/2013 4:05 PM  Signature: Joslyn Devon, RN 02/16/2013 4:05 PM  Signature:  02/16/2013 4:05 PM  Signature:  02/16/2013 4:05 PM  Signature:   02/16/2013 4:05 PM  Signature:    Signature:    Signature:    Signature:    Signature:    Signature:      Scribe for Treatment Team:   Richelle Ito, LCSW  02/16/2013 4:05 PM

## 2013-02-16 NOTE — ED Provider Notes (Signed)
Medical screening examination/treatment/procedure(s) were performed by non-physician practitioner and as supervising physician I was immediately available for consultation/collaboration.  Derwood Kaplan, MD 02/16/13 [phone]

## 2013-02-16 NOTE — Progress Notes (Signed)
Patient resting quietly with eyes closed. Respirations even and unlabored, no distress noted. Q 15 minutes check continues as scheduled to maintain safety.  

## 2013-02-16 NOTE — [facility] Suicide Risk Assessment (Signed)
[facility] INPATIENT:  Family/Significant Other Suicide Prevention Education  Suicide Prevention Education:  Education Completed; Sonia Stickels, mother [phone] has been identified by the patient as the family member/significant other with whom the patient will be residing, and identified as the person(s) who will aid the patient in the event of a mental health crisis (suicidal ideations/suicide attempt).  With written consent from the patient, the family member/significant other has been provided the following suicide prevention education, prior to the and/or following the discharge of the patient.  The suicide prevention education provided includes the following:  Suicide risk factors  Suicide prevention and interventions  National Suicide Hotline telephone number  [facility] assessment telephone number  [facility] Emergency Assistance 911  [facility] and/or Residential Mobile Crisis Unit telephone number  Request made of family/significant other to:  Remove weapons (e.g., guns, rifles, knives), all items previously/currently identified as safety concern.    Remove drugs/medications (over-the-counter, prescriptions, illicit drugs), all items previously/currently identified as a safety concern.  The family member/significant other verbalizes understanding of the suicide prevention education information provided.  The family member/significant other agrees to remove the items of safety concern listed above.  Daryel Gerald B 02/16/2013, 4:56 PM

## 2013-02-16 NOTE — Progress Notes (Signed)
[facility] Adult Case Management Discharge Plan :  Will you be returning to the same living situation after discharge: Yes,  home At discharge, do you have transportation home?:Yes,  family Do you have the ability to pay for your medications:Yes,  mental health  Release of information consent forms completed and in the chart;  Patient's signature needed at discharge.  Patient to Follow up at: Follow-up Information   Follow up with [facility]. (Go to the walk in clinc M-F between 8 and 9AM for your hospital follow up appointment)    Contact information:   [address]  Mario [name] [place]  [phone]      Patient denies SI/HI:   Yes,  yes    Safety Planning and Suicide Prevention discussed:  Yes,  yes  Ida Rogue 02/16/2013, 4:07 PM

## 2013-02-16 NOTE — Progress Notes (Signed)
Patient ID: Mario [name], male   DOB: 1991-02-27, 22 y.o.   MRN: [id number]  [facility] MD Progress Note  02/16/2013 3:38 PM TRINO HIGINBOTHAM  MRN:  [id number] Subjective:   Patient states "I'm sleeping good. I may go live in [place]. After I leave here. I don't understand why I'm being held here since it's been 72 hours since I came.   Objective:  Patient continues to be argumentative about his reasons for admission and questioning his IVC status to multiple staff. He remains agitated, restless, pacing the halls, and showing poor judgment. He continues to deny any prior problems or drug use stating "I did syrup years ago not recently. I just need to get on with my life." Patient observed demanding to be discharged this morning and required verbal de-escalation from staff.   Diagnosis:  Axis I:Psychotic disorder                               Cannabis use disorder  ADL's:  Intact  Sleep: Fair  Appetite:  Fair  Suicidal Ideation: denies  Homicidal Ideation: denies  AEB (as evidenced by):  Psychiatric Specialty Exam: Review of Systems  Constitutional: Negative.   HENT: Negative.   Eyes: Negative.   Respiratory: Negative.   Cardiovascular: Negative.   Gastrointestinal: Negative.   Genitourinary: Negative.   Musculoskeletal: Negative.   Skin: Negative.   Neurological: Negative.   Endo/Heme/Allergies: Negative.   Psychiatric/Behavioral: Positive for hallucinations and substance abuse. Negative for depression, suicidal ideas and memory loss. The patient is nervous/anxious and has insomnia.     Blood pressure 138/76, pulse 99, temperature 98.5 F (36.9 C), temperature source Oral, resp. rate 18, height 5' 6.14" (1.68 m), weight 71.668 kg (158 lb).Body mass index is 25.39 kg/(m^2).  General Appearance: Fairly Groomed  Patent attorney::  Fair  Speech:  Pressured and rapid  Volume:  Normal  Mood:  Dysphoric  Affect:  Blunt  Thought Process:  Disorganized  Orientation:  Full (Time, Place,  and Person)  Thought Content:  Delusions and Rumination  Suicidal Thoughts:  No  Homicidal Thoughts:  No  Memory:  Immediate;   Fair Recent;   Fair Remote;   Fair  Judgement:  Poor  Insight:  Lacking  Psychomotor Activity:  Normal  Concentration:  Fair  Recall:  Fair  Akathisia:  No  Handed:  Right  AIMS (if indicated):     Assets:  Communication Skills Desire for Improvement Social Support Others:  family support  Sleep:  Number of Hours: 6.25   Current Medications: Current Facility-Administered Medications  Medication Dose Route Frequency Provider Last Rate Last Dose  . acetaminophen (TYLENOL) tablet 650 mg  650 mg Oral Q6H PRN Earney Navy, NP      . alum & mag hydroxide-simeth (MAALOX/MYLANTA) 200-200-20 MG/5ML suspension 30 mL  30 mL Oral Q4H PRN Earney Navy, NP      . benztropine (COGENTIN) tablet 0.5 mg  0.5 mg Oral BID Mojeed Akintayo      . carbamazepine (TEGRETOL) tablet 200 mg  200 mg Oral BID PC Mojeed Akintayo      . fluPHENAZine (PROLIXIN) tablet 5 mg  5 mg Oral BID PC Mojeed Akintayo      . hydrOXYzine (ATARAX/VISTARIL) tablet 25 mg  25 mg Oral QHS PRN Earney Navy, NP      . magnesium hydroxide (MILK OF MAGNESIA) suspension 30 mL  30 mL Oral Daily  PRN Earney Navy, NP      . OLANZapine zydis (ZYPREXA) disintegrating tablet 10 mg  10 mg Oral Q8H PRN Mojeed Akintayo        Lab Results:  No results found for this or any previous visit (from the past 48 hour(s)).  Physical Findings: AIMS: Facial and Oral Movements Muscles of Facial Expression: None, normal Lips and Perioral Area: None, normal Jaw: None, normal Tongue: None, normal,Extremity Movements Lower (legs, knees, ankles, toes): None, normal, Trunk Movements Neck, shoulders, hips: None, normal, Overall Severity Severity of abnormal movements (highest score from questions above): None, normal Incapacitation due to abnormal movements: None, normal Patient's awareness of abnormal  movements (rate only patient's report): No Awareness, Dental Status Current problems with teeth and/or dentures?: No Does patient usually wear dentures?: No  CIWA:    COWS:     Treatment Plan Summary: Daily contact with patient to assess and evaluate symptoms and progress in treatment Medication management  Plan: Continue crisis management and stabilization.  Medication management: Increase Prolixin 5 mg BID to address symptoms of psychosis and agitation. Add Tegretol 200 mg BID to improve stability of patient's mood.  Encouraged patient to attend groups and participate in group counseling sessions and activities.  Discharge plan in progress.  Continue current treatment plan.  Address health issues: Vitals reviewed and stable.   Medical Decision Making Problem Points:  Established problem, stable/improving (1), Review of last therapy session (1) and Review of psycho-social stressors (1) Data Points:  Order Aims Assessment (2) Review of medication regiment & side effects (2) Review of new medications or change in dosage (2)  I certify that inpatient services furnished can reasonably be expected to improve the patient's condition.   Fransisca Kaufmann, NP-C 02/16/2013, 3:38 PM

## 2013-02-16 NOTE — Progress Notes (Signed)
D:  Patient up and present in the milieu most of the shift.  Started out the day very irritable and tangential.  Wanted to be able to speak with Dr. Jannifer Franklin.  Made repeated requests stating he was told he was going to be discharged today.  Demanding to be discharged today and stated he had been here for 72 hours and should leave.  MHT Edwinna Areola spent a great deal of time explaining the difference between voluntary and involuntary commitment before the patient finally demonstrated some level of understanding.  He did also agree to meet with the nurse practitioner and was cooperative with that interview.  A:  Medications given as prescribed.  Encouraged patient to participate in all groups.  Educated patient to the process of discharge and informed him that he would be seeing the NP today and not the doctor.  Also explained to patient that he would not be leaving today.   R:  Tangential, but did finally verbalize understanding of the process.  Loud this morning, but has been quieter and cooperative this afternoon.  Was very polite while meeting with the NP and expressed agreement in staying until tomorrow.  He denies depressive symptoms or suicidal thoughts.  Interacting well with peers on the unit.

## 2013-02-16 NOTE — Progress Notes (Signed)
Patient ID: Mario [name], male   DOB: [birth year], 22 y.o.   MRN: [id number]   D: Patient playing cards with peer in dayroom tonight and went to [place] group. Patient listened to others sing. Patient has had no agitation or thought blocking tonight. A: Staff will monitor on q 15 minute checks, follow treatment plan, and give meds as ordered. R: No complaints tonight and cooperative on the unit.

## 2013-02-16 NOTE — [facility] Group Notes (Signed)
[facility] Group Notes:  (Counselor/Nursing/MHT/Case Management/Adjunct)  02/16/2013 1:15PM  Type of Therapy:  Group Therapy  Participation Level: None  Participation Quality:  Appropriate  Affect:  Flat  Cognitive:  Oriented  Insight:  INone  Engagement in Group:  Limited  Engagement in Therapy:  None  Modes of Intervention:  Discussion, Exploration and Socialization  Summary of Progress/Problems: The topic for group was balance in life.  Pt participated in the discussion about when their life was in balance and out of balance and how this feels.  Pt discussed ways to get back in balance and short term goals they can work on to get where they want to be. Mario [name] sat attentively through group.  When asked for input, he stated "everything I was going to say has already been covered."  He had no contributions of his own to make.   Daryel Gerald B 02/16/2013 4:04 PM

## 2013-02-16 NOTE — Clinical Social Work Note (Signed)
Spoke with mother who feels son is not getting the help he needs here.  "Someone needs to be talking to him individually to help get to the root of the problem."  Explained that her son has limited insight, and that medication really can be the most helpful thing for him.  She would like him d/ced ASAP.  He will return home with her and follow up at [facility].

## 2013-02-17 MED ORDER — CARBAMAZEPINE 200 MG PO TABS: 200.0000 mg | ORAL_TABLET | Freq: Two times a day (BID) | ORAL | Status: DC

## 2013-02-17 MED ORDER — HYDROXYZINE HCL 25 MG PO TABS: 25.0000 mg | ORAL_TABLET | Freq: Every evening | ORAL | Status: DC | PRN

## 2013-02-17 MED ORDER — BENZTROPINE MESYLATE 0.5 MG PO TABS: 0.5000 mg | ORAL_TABLET | Freq: Two times a day (BID) | ORAL | Status: DC

## 2013-02-17 MED ORDER — FLUPHENAZINE HCL 5 MG PO TABS: 5.0000 mg | ORAL_TABLET | Freq: Two times a day (BID) | ORAL | Status: DC

## 2013-02-17 NOTE — Progress Notes (Signed)
Patient Discharge Instructions:  After Visit Summary (AVS):   Faxed to:  02/17/13 Discharge Summary Note:   Faxed to:  02/17/13 Suicide Risk Assessment - Discharge Assessment:   Faxed to:  02/17/13 Faxed/Sent to the Next Level Care provider:  02/17/13  Tonna Corner, 02/17/2013, 11:34 AM  faxed TO [facility] [phone]

## 2013-02-17 NOTE — Progress Notes (Signed)
D/C instructions/meds/follow-up appointments reviewed, pt verbalized understanding, pt's belongings returned to pt, samples given. 
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
## 2013-02-17 NOTE — Progress Notes (Signed)
Nrsg DC   Pt noted to be paranoid and guarded at AM  Med pass and he was observed by this writer holding pills ( with water to be swallowed) for several minutes in his mouth, before spitting  The mixture out.. Nurse  Notified Dr. Jannifer Franklin this AM .   DC order and DC SRA were completed by MD, per protocol. Pt denies SI within the past 24 hrs, he rates his depression and hopelessness " 0/0" and he states his DC plan is " I will stay with my close peers instead of outside". He is given his DC AVS by AT RN. All belongings were returned to him at that time also and he was given prescriptions for maintenance meds as well as sample meds to take. HE was escorted to bldg and  DC'd to home.

## 2013-02-17 NOTE — [facility] Suicide Risk Assessment (Signed)
Suicide Risk Assessment  Discharge Assessment     Demographic Factors:  Male, Low socioeconomic status and Unemployed  Mental Status Per Nursing Assessment::   On Admission:  Suicidal ideation indicated by patient;Suicidal ideation indicated by others;Suicide plan;Self-harm thoughts  Current Mental Status by Physician: patient denies suicidal ideation, intent or plan  Loss Factors: Financial problems/change in socioeconomic status  Historical Factors: Family history of mental illness or substance abuse and Impulsivity  Risk Reduction Factors:   Living with another person, especially a relative and Positive social support  Continued Clinical Symptoms:  Alcohol/Substance Abuse/Dependencies  Cognitive Features That Contribute To Risk:  Closed-mindedness Polarized thinking    Suicide Risk:  Minimal: No identifiable suicidal ideation.  Patients presenting with no risk factors but with morbid ruminations; may be classified as minimal risk based on the severity of the depressive symptoms  Discharge Diagnoses:   AXIS I:  Psychotic disorder              Cannabis use disorder  AXIS II:  Deferred AXIS III:  History reviewed. No pertinent past medical history. AXIS IV:  other psychosocial or environmental problems and problems related to social environment AXIS V:  61-70 mild symptoms  Plan Of Care/Follow-up recommendations:  Activity:  as tolerated Diet:  healthy Tests:  routine Other:  patient to keep his after care appointment  Is patient on multiple antipsychotic therapies at discharge:  No   Has Patient had three or more failed trials of antipsychotic monotherapy by history:  No  Recommended Plan for Multiple Antipsychotic Therapies: N/A  Dreyden Rohrman,MD 02/17/2013, 9:11 AM

## 2013-02-20 NOTE — Discharge Summary (Signed)
Seen and agreed. Yoshio Seliga, MD 

## 2014-03-13 ENCOUNTER — Emergency Department (HOSPITAL_COMMUNITY)
Admission: EM | Admit: 2014-03-13 | Discharge: 2014-03-14 | Disposition: A | Discharge status: Other Acute Inpatient Hospital | Payer: PRIVATE HEALTH INSURANCE | Attending: Emergency Medicine | Admitting: Emergency Medicine

## 2014-03-13 ENCOUNTER — Encounter (HOSPITAL_COMMUNITY): Payer: Self-pay | Admitting: Emergency Medicine

## 2014-03-13 DIAGNOSIS — Y939 Activity, unspecified: Secondary | ICD-10-CM | POA: Insufficient documentation

## 2014-03-13 DIAGNOSIS — Z23 Encounter for immunization: Secondary | ICD-10-CM | POA: Insufficient documentation

## 2014-03-13 DIAGNOSIS — F17200 Nicotine dependence, unspecified, uncomplicated: Secondary | ICD-10-CM | POA: Insufficient documentation

## 2014-03-13 DIAGNOSIS — Z008 Encounter for other general examination: Secondary | ICD-10-CM | POA: Insufficient documentation

## 2014-03-13 DIAGNOSIS — Z79899 Other long term (current) drug therapy: Secondary | ICD-10-CM | POA: Insufficient documentation

## 2014-03-13 DIAGNOSIS — S71109A Unspecified open wound, unspecified thigh, initial encounter: Secondary | ICD-10-CM | POA: Insufficient documentation

## 2014-03-13 DIAGNOSIS — X58XXXA Exposure to other specified factors, initial encounter: Secondary | ICD-10-CM | POA: Insufficient documentation

## 2014-03-13 DIAGNOSIS — S71009A Unspecified open wound, unspecified hip, initial encounter: Secondary | ICD-10-CM | POA: Insufficient documentation

## 2014-03-13 DIAGNOSIS — F319 Bipolar disorder, unspecified: Secondary | ICD-10-CM | POA: Insufficient documentation

## 2014-03-13 DIAGNOSIS — Y929 Unspecified place or not applicable: Secondary | ICD-10-CM | POA: Insufficient documentation

## 2014-03-13 DIAGNOSIS — F29 Unspecified psychosis not due to a substance or known physiological condition: Secondary | ICD-10-CM | POA: Insufficient documentation

## 2014-03-13 HISTORY — DX: Bipolar disorder, unspecified: F31.9

## 2014-03-13 HISTORY — DX: Schizophrenia, unspecified: F20.9

## 2014-03-13 LAB — CBC
HCT: 40.7 % (ref 39.0–52.0)
Hemoglobin: 13.7 g/dL (ref 13.0–17.0)
MCH: 28.4 pg (ref 26.0–34.0)
MCHC: 33.7 g/dL (ref 30.0–36.0)
MCV: 84.4 fL (ref 78.0–100.0)
PLATELETS: 195 10*3/uL (ref 150–400)
RBC: 4.82 MIL/uL (ref 4.22–5.81)
RDW: 11.9 % (ref 11.5–15.5)
WBC: 10.4 10*3/uL (ref 4.0–10.5)

## 2014-03-13 LAB — COMPREHENSIVE METABOLIC PANEL
ALT: 8 U/L (ref 0–53)
AST: 18 U/L (ref 0–37)
Albumin: 4.5 g/dL (ref 3.5–5.2)
Alkaline Phosphatase: 54 U/L (ref 39–117)
Anion gap: 15 (ref 5–15)
BUN: 14 mg/dL (ref 6–23)
CALCIUM: 10 mg/dL (ref 8.4–10.5)
CO2: 25 mEq/L (ref 19–32)
CREATININE: 1 mg/dL (ref 0.50–1.35)
Chloride: 98 mEq/L (ref 96–112)
GFR calc Af Amer: >90 mL/min (ref >90)
GFR calc non Af Amer: >90 mL/min (ref >90)
Glucose, Bld: 104 mg/dL — ABNORMAL HIGH (ref 70–99)
Potassium: 3.9 mEq/L (ref 3.7–5.3)
Sodium: 138 mEq/L (ref 137–147)
TOTAL PROTEIN: 8.8 g/dL — AB (ref 6.0–8.3)
Total Bilirubin: 0.5 mg/dL (ref 0.3–1.2)

## 2014-03-13 LAB — SALICYLATE LEVEL

## 2014-03-13 LAB — ETHANOL

## 2014-03-13 LAB — ACETAMINOPHEN LEVEL: Acetaminophen (Tylenol), Serum: <15 ug/mL (ref 10–30)

## 2014-03-13 MED ORDER — CARBAMAZEPINE 200 MG PO TABS
200.0000 mg | ORAL_TABLET | Freq: Two times a day (BID) | ORAL | Status: DC
  Administered 2014-03-13 – 2014-03-14 (×3): 200 mg via ORAL
  Filled 2014-03-13 (×4): qty 1

## 2014-03-13 MED ORDER — NICOTINE 21 MG/24HR TD PT24: 21.0000 mg | MEDICATED_PATCH | Freq: Every day | TRANSDERMAL | Status: DC

## 2014-03-13 MED ORDER — HYDROXYZINE HCL 25 MG PO TABS: 25.0000 mg | ORAL_TABLET | Freq: Every evening | ORAL | Status: DC | PRN

## 2014-03-13 MED ORDER — FLUPHENAZINE HCL 5 MG PO TABS
5.0000 mg | ORAL_TABLET | Freq: Two times a day (BID) | ORAL | Status: DC
  Administered 2014-03-13 – 2014-03-14 (×3): 5 mg via ORAL
  Filled 2014-03-13 (×4): qty 1

## 2014-03-13 MED ORDER — IBUPROFEN 200 MG PO TABS: 600.0000 mg | ORAL_TABLET | Freq: Three times a day (TID) | ORAL | Status: DC | PRN

## 2014-03-13 MED ORDER — ZIPRASIDONE MESYLATE 20 MG IM SOLR
20.0000 mg | Freq: Once | INTRAMUSCULAR | Status: AC
  Administered 2014-03-13: 20 mg via INTRAMUSCULAR
  Filled 2014-03-13: qty 20

## 2014-03-13 MED ORDER — TETANUS-DIPHTH-ACELL PERTUSSIS 5-2.5-18.5 LF-MCG/0.5 IM SUSP
0.5000 mL | Freq: Once | INTRAMUSCULAR | Status: AC
  Administered 2014-03-13: 0.5 mL via INTRAMUSCULAR
  Filled 2014-03-13: qty 0.5

## 2014-03-13 MED ORDER — BENZTROPINE MESYLATE 1 MG PO TABS
0.5000 mg | ORAL_TABLET | Freq: Two times a day (BID) | ORAL | Status: DC
  Administered 2014-03-14: 0.5 mg via ORAL
  Filled 2014-03-13: qty 1

## 2014-03-13 MED ORDER — ALUM & MAG HYDROXIDE-SIMETH 200-200-20 MG/5ML PO SUSP: 30.0000 mL | ORAL | Status: DC | PRN

## 2014-03-13 MED ORDER — ONDANSETRON HCL 4 MG PO TABS: 4.0000 mg | ORAL_TABLET | Freq: Three times a day (TID) | ORAL | Status: DC | PRN

## 2014-03-13 MED ORDER — ACETAMINOPHEN 325 MG PO TABS: 650.0000 mg | ORAL_TABLET | ORAL | Status: DC | PRN

## 2014-03-13 MED ORDER — STERILE WATER FOR INJECTION IJ SOLN
INTRAMUSCULAR | Status: AC
  Administered 2014-03-13: 1.2 mL
  Filled 2014-03-13: qty 10

## 2014-03-13 NOTE — ED Provider Notes (Signed)
CSN: [id number]     Arrival date & time 03/13/14  1634 History   First MD Initiated Contact with Patient 03/13/14 1706    This chart was scribed for Arthor Captain, PA, with Juliet Rude. Rubin Payor, MD by Tonye Royalty, ED Scribe. This patient was seen in room WBH39/WBH39 and the patient's care was started at 5:51 PM.    Chief Complaint  Patient presents with  . IVC    The history is provided by the patient. No language interpreter was used.    HPI Comments: Mario [name] is a 23 y.o. male who presents to the Emergency Department under IVC papers from [facility]. LEvel 5 caveat due to psych disorder IVC paperwork shows that patient has been noncompliant with medications and . According to paperwork form [facility], there is a large skin wound to the Right thigh. Patient would not  Allow [facility] to evaluate. He would not allow [facility] to bathe him and he is extremely dirty and foul smelling.he is uncooperative, repeatedly saying "it's nothing" and he reports spilling bleach on his boxers.  Past Medical History  Diagnosis Date  . Bipolar 1 disorder   . Schizophrenia    History reviewed. No pertinent past surgical history. No family history on file. History  Substance Use Topics  . Smoking status: Current Every Day Smoker -- 0.50 packs/day  . Smokeless tobacco: Not on file  . Alcohol Use: No     Comment: occasionally    Review of Systems  Unable to perform ROS    Allergies  Carbamazepine; Cogentin; and Prolixin  Home Medications   Prior to Admission medications   Medication Sig Start Date End Date Taking? Authorizing Provider  benztropine (COGENTIN) 0.5 MG tablet Take 1 tablet (0.5 mg total) by mouth 2 (two) times daily. 02/17/13   Fransisca Kaufmann, NP  carbamazepine (TEGRETOL) 200 MG tablet Take 1 tablet (200 mg total) by mouth 2 (two) times daily after a meal. For mood stability. 02/17/13   Fransisca Kaufmann, NP  diphenhydrAMINE (BENADRYL) 25 MG tablet Take 1 tablet (25 mg total) by mouth every 6  (six) hours as needed for itching or allergies (Rash). 03/15/14   Jennifer L Piepenbrink, PA-C  fluPHENAZine (PROLIXIN) 5 MG tablet Take 1 tablet (5 mg total) by mouth 2 (two) times daily after a meal. 02/17/13   Fransisca Kaufmann, NP  hydrOXYzine (ATARAX/VISTARIL) 25 MG tablet Take 1 tablet (25 mg total) by mouth at bedtime as needed (sleep). 02/17/13   Fransisca Kaufmann, NP   BP 131/78  Pulse 80  Temp(Src) 98 F (36.7 C) (Oral)  Resp 17  SpO2 99% Physical Exam  Nursing note and vitals reviewed. Constitutional: He is oriented to person, place, and time. He appears well-developed and well-nourished.  HENT:  Head: Normocephalic and atraumatic.  Eyes: Conjunctivae are normal.  Neck: Normal range of motion. Neck supple.  Pulmonary/Chest: Effort normal.  Musculoskeletal: Normal range of motion.  Neurological: He is alert and oriented to person, place, and time.  Skin: Skin is warm and dry.  20 x 10 cm wound of varying degrees of depth on the Right lateral thigh.  Psychiatric: He has a normal mood and affect.  No apparent infection of skin  ED Course  Procedures (including critical care time) Labs Review Labs Reviewed  COMPREHENSIVE METABOLIC PANEL - Abnormal; Notable for the following:    Glucose, Bld 104 (*)    Total Protein 8.8 (*)    All other components within normal limits  SALICYLATE LEVEL -  Abnormal; Notable for the following:    Salicylate Lvl <2.0 (*)    All other components within normal limits  ACETAMINOPHEN LEVEL  CBC  ETHANOL    Imaging Review No results found.   EKG Interpretation None     DIAGNOSTIC STUDIES: Oxygen Saturation is 100% on room air, normal by my interpretation.    COORDINATION OF CARE:   MDM   Final diagnoses:  Psychosis, unspecified psychosis type   IVC. Overt psychosis. Patient is covered in his own filth. Needs inpatient stabilization. I personally performed the services described in this documentation, which was scribed in my presence. The  recorded information has been reviewed and is accurate.     Arthor Captain, PA-C 03/18/14 1924

## 2014-03-13 NOTE — ED Notes (Signed)
PT brought in from [place] under IVC papers that states: Pt bipolar schizo; most recent hospitalization was July 24, spent 5 days in [facility]; pt not taking medications. [employer] took pt into custody as he was walking in and out of traffic; violent toward property, no hygiene. Pt sits staring at walls talking to no one.   Pt couldn't stay at [facility] due to wound.

## 2014-03-13 NOTE — ED Notes (Signed)
[facility] called about pt, pt needs to be medically evaluated and then after results are back call [facility] to see if they will accept him. Staff report pt has an open area on upper right thigh he is not letting staff assess, staff saw blood and think area is oozing. Pt lives at home with mom and has not been eating or drinking. Pt has been urinating on himself and laying in his urine. Pt is malodorous and refuses to shower.

## 2014-03-14 ENCOUNTER — Inpatient Hospital Stay (HOSPITAL_COMMUNITY)
Admission: AD | Admit: 2014-03-14 | Discharge: 2014-03-30 | DRG: 885 | Disposition: A | Discharge status: Home / Self Care | Payer: Medicaid Other | Source: Intra-hospital | Attending: Emergency Medicine | Admitting: Emergency Medicine

## 2014-03-14 ENCOUNTER — Inpatient Hospital Stay (HOSPITAL_COMMUNITY): Admission: AD | Admit: 2014-03-14 | Payer: Self-pay | Source: Intra-hospital | Admitting: Registered Nurse

## 2014-03-14 ENCOUNTER — Encounter (HOSPITAL_COMMUNITY): Payer: Self-pay | Admitting: Registered Nurse

## 2014-03-14 ENCOUNTER — Encounter (HOSPITAL_COMMUNITY): Payer: Self-pay | Admitting: *Deleted

## 2014-03-14 DIAGNOSIS — T783XXA Angioneurotic edema, initial encounter: Secondary | ICD-10-CM | POA: Diagnosis present

## 2014-03-14 DIAGNOSIS — R238 Other skin changes: Secondary | ICD-10-CM

## 2014-03-14 DIAGNOSIS — F2089 Other schizophrenia: Secondary | ICD-10-CM | POA: Diagnosis not present

## 2014-03-14 DIAGNOSIS — F201 Disorganized schizophrenia: Secondary | ICD-10-CM

## 2014-03-14 DIAGNOSIS — S70311D Abrasion, right thigh, subsequent encounter: Secondary | ICD-10-CM

## 2014-03-14 DIAGNOSIS — F1210 Cannabis abuse, uncomplicated: Secondary | ICD-10-CM | POA: Diagnosis present

## 2014-03-14 DIAGNOSIS — T1490XA Injury, unspecified, initial encounter: Secondary | ICD-10-CM | POA: Diagnosis present

## 2014-03-14 DIAGNOSIS — F17200 Nicotine dependence, unspecified, uncomplicated: Secondary | ICD-10-CM | POA: Diagnosis present

## 2014-03-14 DIAGNOSIS — F319 Bipolar disorder, unspecified: Secondary | ICD-10-CM | POA: Diagnosis present

## 2014-03-14 DIAGNOSIS — IMO0002 Reserved for concepts with insufficient information to code with codable children: Secondary | ICD-10-CM | POA: Diagnosis not present

## 2014-03-14 DIAGNOSIS — T7840XA Allergy, unspecified, initial encounter: Secondary | ICD-10-CM

## 2014-03-14 DIAGNOSIS — F29 Unspecified psychosis not due to a substance or known physiological condition: Secondary | ICD-10-CM | POA: Diagnosis present

## 2014-03-14 DIAGNOSIS — Z9119 Patient's noncompliance with other medical treatment and regimen: Secondary | ICD-10-CM | POA: Diagnosis not present

## 2014-03-14 DIAGNOSIS — X58XXXA Exposure to other specified factors, initial encounter: Secondary | ICD-10-CM | POA: Diagnosis present

## 2014-03-14 DIAGNOSIS — T148XXA Other injury of unspecified body region, initial encounter: Secondary | ICD-10-CM | POA: Diagnosis present

## 2014-03-14 DIAGNOSIS — Z91199 Patient's noncompliance with other medical treatment and regimen due to unspecified reason: Secondary | ICD-10-CM | POA: Diagnosis not present

## 2014-03-14 MED ORDER — MAGNESIUM HYDROXIDE 400 MG/5ML PO SUSP: 30.0000 mL | Freq: Every day | ORAL | Status: DC | PRN

## 2014-03-14 MED ORDER — BENZTROPINE MESYLATE 0.5 MG PO TABS
0.5000 mg | ORAL_TABLET | Freq: Two times a day (BID) | ORAL | Status: DC
  Administered 2014-03-15: 0.5 mg via ORAL
  Filled 2014-03-14 (×5): qty 1

## 2014-03-14 MED ORDER — HYDROXYZINE HCL 25 MG PO TABS: 25.0000 mg | ORAL_TABLET | Freq: Every evening | ORAL | Status: DC | PRN

## 2014-03-14 MED ORDER — CARBAMAZEPINE 200 MG PO TABS
200.0000 mg | ORAL_TABLET | Freq: Two times a day (BID) | ORAL | Status: DC
  Administered 2014-03-15: 200 mg via ORAL
  Filled 2014-03-14 (×3): qty 1

## 2014-03-14 MED ORDER — FLUPHENAZINE HCL 5 MG PO TABS
5.0000 mg | ORAL_TABLET | Freq: Two times a day (BID) | ORAL | Status: DC
  Administered 2014-03-15: 5 mg via ORAL
  Filled 2014-03-14 (×3): qty 1

## 2014-03-14 MED ORDER — SILVER SULFADIAZINE 1 % EX CREA
TOPICAL_CREAM | Freq: Once | CUTANEOUS | Status: AC
  Administered 2014-03-14: 16:00:00 via TOPICAL
  Filled 2014-03-14 (×2): qty 85

## 2014-03-14 NOTE — Progress Notes (Signed)
[facility] Community Liaison Stacy,  ° °Provided pt with a [facility] Card application, highlighting [facility], to help patient establish primary care.  °

## 2014-03-14 NOTE — ED Notes (Signed)
Writer and Dr. Jodi Mourning were able to convince pt to allow [place] to see the wound to the upper right thigh. MD will order wound care

## 2014-03-14 NOTE — Progress Notes (Signed)
Pt admitted to [facility].  Pt states that he was "walking into the street."  Pt has poor eye contact and poor hygiene. Pt is verbally minimal.  Pt states that he takes his medications at home.  Pt's mood is somber and sad.  Pt's belongings accounted for and pt oriented to unit.  Pt interacting with staff appropriately.  Pt generally answering questions with "yes" or "no" answers.  Body check completed and pt shown to his room.  Pt came to unit with silvadene lotion from the ED which was locked in pt's locker.

## 2014-03-14 NOTE — ED Notes (Signed)
Patient refuse to let writer examine wound on his thigh at this time

## 2014-03-14 NOTE — Progress Notes (Signed)
  CARE MANAGEMENT ED NOTE 03/14/2014  Patient:  Mario [name], Mario [name]   Account Number:  [id number]  Date Initiated:  03/14/2014  Documentation initiated by:  Edd Arbour  Subjective/Objective Assessment:   23 yr old self pay [facility] called about pt, pt needs to be medically evaluated and then after results are back call [facility] to see if they will accept him. Staff report pt has an open area on upper right thigh he is not letting staff assess     Subjective/Objective Assessment Detail:   staff saw blood and think area is oozing. Pt lives at home with mom and has not been eating or drinking. Pt has been urinating on himself and laying in his urine. Pt is malodorous and refuses to shower.        Pt Confirms no pcp     Action/Plan:   ED Cm spoke with pt about pcp Discussed [facility] see note below   Action/Plan Detail:   Anticipated DC Date:       Status Recommendation to Physician:   Result of Recommendation:    Other ED Services  Consult Working Plan    DC Planning Services  Other  PCP issues  Outpatient Services - Pt will follow up    Choice offered to / List presented to:            Status of service:  Completed, signed off  ED Comments:   ED Comments Detail:  CM spoke with pt who confirms self pay [facility] resident with no pcp. CM discussed and provided written information for self pay pcps, importance of pcp for f/u care, www.needymeds.org, discounted pharmacies and other [employer] such as [employer], [employer], affordable care act,  financial assistance, DSS and  health department Reviewed resources for [employer] self pay pcps like [facility]ussmaul, family medicine at [address], [facility] family practice, general medical clinics, [facility] urgent care plus others, medication resources, [facility] out patient pharmacies and housing Pt voiced understanding and appreciation of resources provided   Provided [facility] contact information

## 2014-03-14 NOTE — ED Notes (Addendum)
Continues to refuse assessment of reported wound to thigh. He denies it has drainage and denies pain to area. Pt reminded of need for urine specimen as well.

## 2014-03-14 NOTE — ED Notes (Signed)
Pt allowed Clinical research associate and [facility] provide wound care per MD order after much encouragement.

## 2014-03-14 NOTE — Progress Notes (Signed)
Did not attend group 

## 2014-03-14 NOTE — Consult Note (Signed)
[facility] Face-to-Face Psychiatry Consult   Reason for Consult:  Walking in the middle of the street. Referring Physician:  EDP  Mario [name] is an 23 y.o. male. Total Time spent with patient: 30 minutes  Assessment: AXIS I:  Psychotic Disorder NOS AXIS II:  Deferred AXIS III:   Past Medical History  Diagnosis Date  . Bipolar 1 disorder   . Schizophrenia    AXIS IV:  other psychosocial or environmental problems AXIS V:  21-30 behavior considerably influenced by delusions or hallucinations OR serious impairment in judgment, communication OR inability to function in almost all areas  Plan:  Recommend psychiatric Inpatient admission when medically cleared.  Subjective:   Mario [name] is a 23 y.o. male patient admitted with Psychotic Disorder NOS.  HPI:  Patient states that he is fine and doesn't know why he is here.  "They say I was walking in the middle of the street; but I wasn't I was crossing the street.  I stayed overnight at [facility] and then they brought me here."  Patient denies suicidal/homicidal ideation, psychosis, and paranoia.   Spoke with patient mother on phone Mario [name] [phone]).  Patient mother states that patient will lay in the bed for days and not get up for nothing.  States that patient will lay in his urine and feces, not even taking a bath or shower for days.  States when she came home from work yesterday that "Mario [name] was laying in the bed and my little one went up stairs and was telling to take a bath.  I went in his room and told him to get up eat and take a shower.  He just jumped out of bed and walked out to the street walking in the middle; cars blowing horns moving around him.  He did it again and wasn't getting out of the street; so I called the police.  The police said that they would hold him at [facility] until I can get the IVC."  Mother states that patient had no problems until January 2015.  "Mario [name] had a GPA of 4.25, played sports at school.  He  went to [place] to work with his uncle and started to that syntenic marijuana.  He started to act strange and his uncle sent him back home because he said he couldn't handle him. He hasn't been right since."  Patient was inpatient 02/2014 and mother states that patient appeared to be better.  After discharge patient went back to the same.  States that she did no know that he was suppose to be taking medication and did not know that he was suppose to follow up at [facility]. "I do know that he is getting worse and he needs help."   HPI Elements:   Location:  Psychotic disorder. Quality:  walking into traffic. Severity:  Not eating or bathing. Timing:  several weeks. Review of Systems  HENT: Negative.   Respiratory: Negative.   Musculoskeletal: Negative.   Psychiatric/Behavioral: Depression: Denies. Suicidal ideas: Denies. Hallucinations: Denies. Memory loss: Denies. Substance abuse: Denies. Nervous/anxious: Denies. Insomnia: Denies.   No family history on file.   Past Psychiatric History: Past Medical History  Diagnosis Date  . Bipolar 1 disorder   . Schizophrenia     reports that he has been smoking.  He does not have any smokeless tobacco history on file. He reports that he uses illicit drugs (Marijuana). He reports that he does not drink alcohol. No family history on file.  Allergies:  No Known Allergies  ACT Assessment Complete:  Yes:    Educational Status    Risk to Self: Risk to self with the past 6 months Is patient at risk for suicide?: No, but patient needs Medical Clearance Substance abuse history and/or treatment for substance abuse?: Yes  Risk to Others:    Abuse:    Prior Inpatient Therapy:    Prior Outpatient Therapy:    Additional Information:                    Objective: Blood pressure 115/76, pulse 76, temperature 97.4 F (36.3 C), temperature source Oral, resp. rate 16, SpO2 100.00%.There is no weight on file to calculate BMI. Results  for orders placed during the hospital encounter of 03/13/14 (from the past 72 hour(s))  ACETAMINOPHEN LEVEL     Status: None   Collection Time    03/13/14  5:00 PM      Result Value Ref Range   Acetaminophen (Tylenol), Serum <15.0  10 - 30 ug/mL   Comment:            THERAPEUTIC CONCENTRATIONS VARY     SIGNIFICANTLY. A RANGE OF 10-30     ug/mL MAY BE AN EFFECTIVE     CONCENTRATION FOR MANY PATIENTS.     HOWEVER, SOME ARE BEST TREATED     AT CONCENTRATIONS OUTSIDE THIS     RANGE.     ACETAMINOPHEN CONCENTRATIONS     >150 ug/mL AT 4 HOURS AFTER     INGESTION AND >50 ug/mL AT 12     HOURS AFTER INGESTION ARE     OFTEN ASSOCIATED WITH TOXIC     REACTIONS.  CBC     Status: None   Collection Time    03/13/14  5:00 PM      Result Value Ref Range   WBC 10.4  4.0 - 10.5 K/uL   RBC 4.82  4.22 - 5.81 MIL/uL   Hemoglobin 13.7  13.0 - 17.0 g/dL   HCT 40.7  39.0 - 52.0 %   MCV 84.4  78.0 - 100.0 fL   MCH 28.4  26.0 - 34.0 pg   MCHC 33.7  30.0 - 36.0 g/dL   RDW 11.9  11.5 - 15.5 %   Platelets 195  150 - 400 K/uL  COMPREHENSIVE METABOLIC PANEL     Status: Abnormal   Collection Time    03/13/14  5:00 PM      Result Value Ref Range   Sodium 138  137 - 147 mEq/L   Potassium 3.9  3.7 - 5.3 mEq/L   Chloride 98  96 - 112 mEq/L   CO2 25  19 - 32 mEq/L   Glucose, Bld 104 (*) 70 - 99 mg/dL   BUN 14  6 - 23 mg/dL   Creatinine, Ser 1.00  0.50 - 1.35 mg/dL   Calcium 10.0  8.4 - 10.5 mg/dL   Total Protein 8.8 (*) 6.0 - 8.3 g/dL   Albumin 4.5  3.5 - 5.2 g/dL   AST 18  0 - 37 U/L   ALT 8  0 - 53 U/L   Alkaline Phosphatase 54  39 - 117 U/L   Total Bilirubin 0.5  0.3 - 1.2 mg/dL   GFR calc non Af Amer >90  >90 mL/min   GFR calc Af Amer >90  >90 mL/min   Comment: (NOTE)     The eGFR has been calculated using the CKD EPI equation.  This calculation has not been validated in all clinical situations.     eGFR's persistently <90 mL/min signify possible Chronic Kidney     Disease.   Anion gap  15  5 - 15  ETHANOL     Status: None   Collection Time    03/13/14  5:00 PM      Result Value Ref Range   Alcohol, Ethyl (B) <11  0 - 11 mg/dL   Comment:            LOWEST DETECTABLE LIMIT FOR     SERUM ALCOHOL IS 11 mg/dL     FOR MEDICAL PURPOSES ONLY  SALICYLATE LEVEL     Status: Abnormal   Collection Time    03/13/14  5:00 PM      Result Value Ref Range   Salicylate Lvl <9.9 (*) 2.8 - 20.0 mg/dL   Labs are reviewed see above values.  Mediatation reviewed and restarted.   Current Facility-Administered Medications  Medication Dose Route Frequency Provider Last Rate Last Dose  . acetaminophen (TYLENOL) tablet 650 mg  650 mg Oral Q4H PRN Margarita Mail, PA-C      . alum & mag hydroxide-simeth (MAALOX/MYLANTA) 200-200-20 MG/5ML suspension 30 mL  30 mL Oral PRN Margarita Mail, PA-C      . benztropine (COGENTIN) tablet 0.5 mg  0.5 mg Oral BID Margarita Mail, PA-C   0.5 mg at 03/14/14 1202  . carbamazepine (TEGRETOL) tablet 200 mg  200 mg Oral BID PC Abigail Harris, PA-C   200 mg at 03/14/14 1202  . fluPHENAZine (PROLIXIN) tablet 5 mg  5 mg Oral BID PC Abigail Harris, PA-C   5 mg at 03/14/14 1202  . hydrOXYzine (ATARAX/VISTARIL) tablet 25 mg  25 mg Oral QHS PRN Margarita Mail, PA-C      . ibuprofen (ADVIL,MOTRIN) tablet 600 mg  600 mg Oral Q8H PRN Margarita Mail, PA-C      . ondansetron (ZOFRAN) tablet 4 mg  4 mg Oral Q8H PRN Margarita Mail, PA-C       Current Outpatient Prescriptions  Medication Sig Dispense Refill  . benztropine (COGENTIN) 0.5 MG tablet Take 1 tablet (0.5 mg total) by mouth 2 (two) times daily.  60 tablet  0  . carbamazepine (TEGRETOL) 200 MG tablet Take 1 tablet (200 mg total) by mouth 2 (two) times daily after a meal. For mood stability.  60 tablet  0  . fluPHENAZine (PROLIXIN) 5 MG tablet Take 1 tablet (5 mg total) by mouth 2 (two) times daily after a meal.  60 tablet  0  . hydrOXYzine (ATARAX/VISTARIL) 25 MG tablet Take 1 tablet (25 mg total) by mouth at bedtime as  needed (sleep).  30 tablet  0    Psychiatric Specialty Exam:     Blood pressure 115/76, pulse 76, temperature 97.4 F (36.3 C), temperature source Oral, resp. rate 16, SpO2 100.00%.There is no weight on file to calculate BMI.  General Appearance: Casual  Eye Contact::  Good  Speech:  Clear and Coherent and Normal Rate  Volume:  Decreased  Mood:  Depressed  Affect:  Congruent, Depressed and Flat  Thought Process:  Circumstantial  Orientation:  Full (Time, Place, and Person)  Thought Content:  "There is nothing wrong with me"  Suicidal Thoughts:  No  Homicidal Thoughts:  No  Memory:  Immediate;   Fair Recent;   Fair Remote;   Poor  Judgement:  Impaired  Insight:  Fair  Psychomotor Activity:  Decreased  Concentration:  Fair  Recall:  [place]iley Houseman of [place]: Fair  Akathisia:  No  Handed:  Right  AIMS (if indicated):     Assets:  Desire for Improvement Housing Social Support  Sleep:      Musculoskeletal: Strength & Muscle Tone: within normal limits Gait & Station: normal Patient leans: N/A  Treatment Plan Summary: Daily contact with patient to assess and evaluate symptoms and progress in treatment Medication management Inpatient treatment recommended  Vestal Markin, FNP-BC 03/14/2014 1:54 PM

## 2014-03-14 NOTE — Tx Team (Signed)
Initial Interdisciplinary Treatment Plan   PATIENT STRESSORS: Substance abuse Poor Hygiene     PROBLEM LIST: Problem List/Patient Goals Date to be addressed Date deferred Reason deferred Estimated date of resolution  Suicide Risk 03/14/2014     Substance Abuse 03/14/2014                                                DISCHARGE CRITERIA:  Adequate post-discharge living arrangements Improved stabilization in mood, thinking, and/or behavior Medical problems require only outpatient monitoring Motivation to continue treatment in a less acute level of care Reduction of life-threatening or endangering symptoms to within safe limits  PRELIMINARY DISCHARGE PLAN: Outpatient therapy  PATIENT/FAMIILY INVOLVEMENT: This treatment plan has been presented to and reviewed with the patient, Mario [name].  The patient and family have been given the opportunity to ask questions and make suggestions.  Bing Quarry 03/14/2014, 7:49 PM

## 2014-03-15 ENCOUNTER — Encounter (HOSPITAL_COMMUNITY): Payer: Self-pay | Admitting: Psychiatry

## 2014-03-15 MED ORDER — DIPHENHYDRAMINE HCL 25 MG PO CAPS
50.0000 mg | ORAL_CAPSULE | Freq: Once | ORAL | Status: AC
  Administered 2014-03-15: 50 mg via ORAL
  Filled 2014-03-15: qty 2

## 2014-03-15 MED ORDER — DIPHENHYDRAMINE HCL 25 MG PO TABS: 25.0000 mg | ORAL_TABLET | Freq: Four times a day (QID) | ORAL | Status: DC | PRN

## 2014-03-15 MED ORDER — DIPHENHYDRAMINE HCL 50 MG/ML IJ SOLN
INTRAMUSCULAR | Status: AC
  Administered 2014-03-15: 10:00:00
  Filled 2014-03-15: qty 1

## 2014-03-15 MED ORDER — PREDNISONE 20 MG PO TABS
60.0000 mg | ORAL_TABLET | Freq: Once | ORAL | Status: AC
  Administered 2014-03-15: 60 mg via ORAL
  Filled 2014-03-15: qty 3

## 2014-03-15 MED ORDER — BACITRACIN 500 UNIT/GM EX OINT
1.0000 "application " | TOPICAL_OINTMENT | Freq: Two times a day (BID) | CUTANEOUS | Status: DC
  Administered 2014-03-18: 1 via TOPICAL
  Filled 2014-03-15 (×35): qty 0.9

## 2014-03-15 MED ORDER — DIPHENHYDRAMINE HCL 50 MG/ML IJ SOLN
50.0000 mg | Freq: Once | INTRAMUSCULAR | Status: DC
  Filled 2014-03-15: qty 1

## 2014-03-15 MED ORDER — FAMOTIDINE 20 MG PO TABS
40.0000 mg | ORAL_TABLET | Freq: Once | ORAL | Status: AC
  Administered 2014-03-15: 40 mg via ORAL
  Filled 2014-03-15: qty 2

## 2014-03-15 NOTE — Progress Notes (Signed)
D: Pt denies SI/HI/AVH. Pt is pleasant and cooperative. Pt guarded and forwards little information.   A: Pt was offered support and encouragement. Pt was given scheduled medications. Pt was encourage to attend groups. Q 15 minute checks were done for safety.   R: Pt is taking medication. Pt has no complaints at this time.Pt receptive to treatment and safety maintained on unit.

## 2014-03-15 NOTE — ED Notes (Signed)
Pt in police custody leaving [facility] ER.

## 2014-03-15 NOTE — Progress Notes (Addendum)
8am-pt presented to the med window in a semi catonic state answering questions with yes and no. Pt denies feeling Si and does contract for safety. Pt took his medicaitons and then took them out of his mouth and put then swallowed them. Pt was talking to the MD at 9;30am and now appears to have facial swelling,lip and eye swelling. Pt doesn't have any difficulty breathing and tongue is not swolle.Per MD pt is to go to ER for evalaution. Vital signs are stable.9:30am 117/86, 100%, 80, 20, 97.6. Pt was given  of benedryl IM in left deltoid. At this time he does not appear in distress. Pt will go to [facility] with police and tech accompanyment- pt has a burn to his left leg which he will not let staff see. Report called to [place] in the Er at [facility]. [place] was made aware than pt has a burn to his leg that he will not let staff see. MD phoned mom at home concerning med allergies and per mom she does not think he has any allergies.

## 2014-03-15 NOTE — Discharge Instructions (Signed)
Please follow up with your primary care physician in 1-2 days. If you do not have one please call the [place] number listed above. Please use Benadryl as needed as prescribed. Please read all discharge instructions and return precautions.    Allergies Allergies may happen from anything your body is sensitive to. This may be food, medicines, pollens, chemicals, and nearly anything around you in everyday life that produces allergens. An allergen is anything that causes an allergy producing substance. Heredity is often a factor in causing these problems. This means you may have some of the same allergies as your parents. Food allergies happen in all age groups. Food allergies are some of the most severe and life threatening. Some common food allergies are cow's milk, seafood, eggs, nuts, wheat, and soybeans. SYMPTOMS   Swelling around the mouth.  An itchy red rash or hives.  Vomiting or diarrhea.  Difficulty breathing. SEVERE ALLERGIC REACTIONS ARE LIFE-THREATENING. This reaction is called anaphylaxis. It can cause the mouth and throat to swell and cause difficulty with breathing and swallowing. In severe reactions only a trace amount of food (for example, peanut oil in a salad) may cause death within seconds. Seasonal allergies occur in all age groups. These are seasonal because they usually occur during the same season every year. They may be a reaction to molds, grass pollens, or tree pollens. Other causes of problems are house dust mite allergens, pet dander, and mold spores. The symptoms often consist of nasal congestion, a runny itchy nose associated with sneezing, and tearing itchy eyes. There is often an associated itching of the mouth and ears. The problems happen when you come in contact with pollens and other allergens. Allergens are the particles in the air that the body reacts to with an allergic reaction. This causes you to release allergic antibodies. Through a  chain of events, these eventually cause you to release histamine into the blood stream. Although it is meant to be protective to the body, it is this release that causes your discomfort. This is why you were given anti-histamines to feel better. If you are unable to pinpoint the offending allergen, it may be determined by skin or blood testing. Allergies cannot be cured but can be controlled with medicine. Hay fever is a collection of all or some of the seasonal allergy problems. It may often be treated with simple over-the-counter medicine such as diphenhydramine. Take medicine as directed. Do not drink alcohol or drive while taking this medicine. Check with your caregiver or package insert for child dosages. If these medicines are not effective, there are many new medicines your caregiver can prescribe. Stronger medicine such as nasal spray, eye drops, and corticosteroids may be used if the first things you try do not work well. Other treatments such as immunotherapy or desensitizing injections can be used if all else fails. Follow up with your caregiver if problems continue. These seasonal allergies are usually not life threatening. They are generally more of a nuisance that can often be handled using medicine. HOME CARE INSTRUCTIONS   If unsure what causes a reaction, keep a diary of foods eaten and symptoms that follow. Avoid foods that cause reactions.  If hives or rash are present:  Take medicine as directed.  You may use an over-the-counter antihistamine (diphenhydramine) for hives and itching as needed.  Apply cold compresses (cloths) to the skin or take baths in cool water. Avoid hot baths or showers. Heat will make a rash and  itching worse.  If you are severely allergic:  Following a treatment for a severe reaction, hospitalization is often required for closer follow-up.  Wear a medic-alert bracelet or necklace stating the allergy.  You and your family must learn how to give  adrenaline or use an anaphylaxis kit.  If you have had a severe reaction, always carry your anaphylaxis kit or EpiPen with you. Use this medicine as directed by your caregiver if a severe reaction is occurring. Failure to do so could have a fatal outcome. SEEK MEDICAL CARE IF:  You suspect a food allergy. Symptoms generally happen within 30 minutes of eating a food.  Your symptoms have not gone away within 2 days or are getting worse.  You develop new symptoms.  You want to retest yourself or your child with a food or drink you think causes an allergic reaction. Never do this if an anaphylactic reaction to that food or drink has happened before. Only do this under the care of a caregiver. SEEK IMMEDIATE MEDICAL CARE IF:   You have difficulty breathing, are wheezing, or have a tight feeling in your chest or throat.  You have a swollen mouth, or you have hives, swelling, or itching all over your body.  You have had a severe reaction that has responded to your anaphylaxis kit or an EpiPen. These reactions may return when the medicine has worn off. These reactions should be considered life threatening. MAKE SURE YOU:   Understand these instructions.  Will watch your condition.  Will get help right away if you are not doing well or get worse. Document Released: 09/15/2002 Document Revised: 10/17/2012 Document Reviewed: 02/20/2008 [facility] Patient Information 2015 [place], [place]. This information is not intended to replace advice given to you by your health care provider. Make sure you discuss any questions you have with your health care provider.

## 2014-03-15 NOTE — Consult Note (Signed)
Face to face evaluation and I agree with this note 

## 2014-03-15 NOTE — Progress Notes (Signed)
Pt resting in bed with eyes closed.  No distress observed.  Respirations even/unlabored.  Safety maintained with q15 minute checks. 
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
## 2014-03-15 NOTE — ED Notes (Signed)
BH RN discussed with this RN that pt's leg needs evaluation before return.

## 2014-03-15 NOTE — ED Provider Notes (Signed)
CSN: [id number]     Arrival date & time 03/15/14  1021 History   First MD Initiated Contact with Patient 03/15/14 1026     Chief Complaint  Patient presents with  . Allergic Reaction     (Consider location/radiation/quality/duration/timing/severity/associated sxs/prior Treatment) HPI Comments: Patient is a 23 year old male past medical history significant for bipolar 1 disorder, schizophrenia, tobacco abuse and over from behavioral health for evaluation of angioedema new onset this morning. Patient denies any lip or tongue swelling. Denies any SOB, nausea, vomiting, abdominal pain. Patient took his regular home medications today. No recent changes to medications.   Patient is a 23 y.o. male presenting with allergic reaction.  Allergic Reaction   Past Medical History  Diagnosis Date  . Bipolar 1 disorder   . Schizophrenia    History reviewed. No pertinent past surgical history. History reviewed. No pertinent family history. History  Substance Use Topics  . Smoking status: Current Every Day Smoker -- 0.50 packs/day  . Smokeless tobacco: Not on file  . Alcohol Use: No     Comment: occasionally    Review of Systems  All other systems reviewed and are negative.     Allergies  Review of patient's allergies indicates no known allergies.  Home Medications   Prior to Admission medications   Medication Sig Start Date End Date Taking? Authorizing Provider  benztropine (COGENTIN) 0.5 MG tablet Take 1 tablet (0.5 mg total) by mouth 2 (two) times daily. 02/17/13   Fransisca Kaufmann, NP  carbamazepine (TEGRETOL) 200 MG tablet Take 1 tablet (200 mg total) by mouth 2 (two) times daily after a meal. For mood stability. 02/17/13   Fransisca Kaufmann, NP  diphenhydrAMINE (BENADRYL) 25 MG tablet Take 1 tablet (25 mg total) by mouth every 6 (six) hours as needed for itching or allergies (Rash). 03/15/14   Anamaria Dusenbury L Oleva Koo, PA-C  fluPHENAZine (PROLIXIN) 5 MG tablet Take 1 tablet (5 mg total) by mouth 2  (two) times daily after a meal. 02/17/13   Fransisca Kaufmann, NP  hydrOXYzine (ATARAX/VISTARIL) 25 MG tablet Take 1 tablet (25 mg total) by mouth at bedtime as needed (sleep). 02/17/13   Fransisca Kaufmann, NP   BP 129/72  Pulse 78  Temp(Src) 99 F (37.2 C) (Oral)  Resp 18  Ht  (1.676 m)  Wt 152 lb (68.947 kg)  BMI 24.55 kg/m2  SpO2 99% Physical Exam  Nursing note and vitals reviewed. Constitutional: He is oriented to person, place, and time. He appears well-developed and well-nourished. No distress.  HENT:  Head: Normocephalic and atraumatic.  Right Ear: External ear normal.  Left Ear: External ear normal.  Nose: Nose normal.  Mouth/Throat: Uvula is midline, oropharynx is clear and moist and mucous membranes are normal. No oropharyngeal exudate.  No angioedema noted on examination.   Eyes: Conjunctivae are normal.  Neck: Normal range of motion. Neck supple.  Cardiovascular: Normal rate, regular rhythm and normal heart sounds.   Pulmonary/Chest: Effort normal and breath sounds normal. No respiratory distress. He has no wheezes. He exhibits no tenderness.  Abdominal: Soft. There is no tenderness.  Musculoskeletal: Normal range of motion. He exhibits no edema.  Neurological: He is alert and oriented to person, place, and time.  Skin: Skin is warm and dry. He is not diaphoretic.  10 x 14 cm healing wound to right upper thigh noted. No surrounding erythema. No warmth. Non-TTP. No fluctuance or induration. Early wound healing stages noted.   Psychiatric: He has a normal mood and affect.  ED Course  Procedures (including critical care time) Medications  magnesium hydroxide (MILK OF MAGNESIA) suspension 30 mL (not administered)  hydrOXYzine (ATARAX/VISTARIL) tablet 25 mg (not administered)  diphenhydrAMINE (BENADRYL) injection 50 mg (50 mg Intramuscular Not Given 03/15/14 0945)  bacitracin ointment 1 application (not administered)  diphenhydrAMINE (BENADRYL) 50 MG/ML injection (  Given  03/15/14 0930)  famotidine (PEPCID) tablet 40 mg (40 mg Oral Given 03/15/14 1055)  predniSONE (DELTASONE) tablet 60 mg (60 mg Oral Given 03/15/14 1055)  diphenhydrAMINE (BENADRYL) capsule 50 mg (50 mg Oral Given 03/15/14 1055)    Labs Review Labs Reviewed - No data to display  Imaging Review No results found.   EKG Interpretation None       MDM   Final diagnoses:  Allergic reaction, initial encounter  Abrasion of right thigh, subsequent encounter    Filed Vitals:   03/15/14 1519  BP: 129/72  Pulse: 78  Temp:   Resp: 18   Afebrile, NAD, non-toxic appearing, AAOx4.   1) Allergic reaction: Patient re-evaluated prior to dc, is hemodynamically stable, in no respiratory distress, and denies the feeling of throat closing. Pt has been advised to take OTC benadryl & return to the ED if they have a mod-severe allergic rxn (s/s including throat closing, difficulty breathing, swelling of lips face or tongue).   2) Wound check: Wound improving from previous ED visits. No signs of superimposed infection. Dry sterile dressing applied.   Return precautions discussed. Patient is agreeable to plan. Stable at time of transfer back to [facility].      Jeannetta Ellis, PA-C 03/15/14 1606

## 2014-03-15 NOTE — ED Notes (Signed)
Called [facility] to notifiy pt returning and has been cleared.

## 2014-03-15 NOTE — Tx Team (Addendum)
Interdisciplinary Treatment Plan Update (Adult)   Date: 03/15/2014   Time Reviewed: 9:10 AM  Progress in Treatment:  Attending groups: No  Participating in groups: No-new admit  Taking medication as prescribed: Yes  Tolerating medication: Yes  Family/Significant othe contact made: Not yet. Collateral info needed from pt's mother if possible.  Patient understands diagnosis: Partially, AEB seeking treatment for medication stabilization. Pt demonstrates limited insight at this time, minimally engaged with staff who are attempting to ask him questions. Discussing patient identified problems/goals with staff: Yes  Medical problems stabilized or resolved: Yes  Denies suicidal/homicidal ideation: Yes during admission/self report  Patient has not harmed self or Others: Yes  New problem(s) identified:  Discharge Plan or Barriers: Pt will return home to his mother and go to [facility] for med management/assessment for therapy services.  Additional comments:  Pt admitted to [facility]. Pt states that he was "walking into the street." Pt has poor eye contact and poor hygiene. Pt is verbally minimal. Pt states that he takes his medications at home. Pt's mood is somber and sad. Pt's belongings accounted for and pt oriented to unit. Pt interacting with staff appropriately. Pt generally answering questions with "yes" or "no" answers. Body check completed and pt shown to his room. Pt came to unit with silvadene lotion from the ED which was locked in pt's locker.     Reason for Continuation of Hospitalization: Mood stabilization Psychosis Med management  Estimated length of stay: 5-7 days  For review of initial/current patient goals, please see plan of care.  Attendees:  Patient:    Family:    Physician: Dr. Elna Breslow MD 03/15/2014 9:09 AM   Nursing: Marylu Lund RN 03/15/2014 9:09 AM   Clinical Social Worker Destry Dauber Smart, LCSWA  03/15/2014 9:09 AM   Other: Daryel Gerald, LCSW 03/15/2014 9:09 AM   Other: Christa RN 03/15/2014  9:10 AM   Other: Charleston Ropes, CSW Intern 03/15/2014 9:10 AM   Other:    Scribe for Treatment Team:  [employer] LCSWA 03/15/2014 9:10 AM    Pt demonstrates no insight regarding hospital admission and reports that he is "alright." Pt not able to identify goals, as he believes that he does not have any mental health issues. Pt agreeable to taking new medication but does not acknowledge mood problems, AVH, SI/HI. He states that he was diagnosed with schizoaffective disorder but was unable to verbalize what this diagnosis was. At this time, pt not able to fully comprehend goal list/treatment plan. CSW assessing.   [employer], LCSWA 03/15/2014 10:21 AM

## 2014-03-15 NOTE — ED Notes (Signed)
Sent here from [facility] for angioedema; took regular meds today

## 2014-03-15 NOTE — [facility] Suicide Risk Assessment (Signed)
[facility] INPATIENT:  Family/Significant Other Suicide Prevention Education  Suicide Prevention Education:  Education Completed; [name] (pt's mother) [phone] has been identified by the patient as the family member/significant other with whom the patient will be residing, and identified as the person(s) who will aid the patient in the event of a mental health crisis (suicidal ideations/suicide attempt).  With written consent from the patient, the family member/significant other has been provided the following suicide prevention education, prior to the and/or following the discharge of the patient.  The suicide prevention education provided includes the following:  Suicide risk factors  Suicide prevention and interventions  National Suicide Hotline telephone number  [facility] assessment telephone number  [facility] Emergency Assistance 911  [facility] and/or Residential Mobile Crisis Unit telephone number  Request made of family/significant other to:  Remove weapons (e.g., guns, rifles, knives), all items previously/currently identified as safety concern.    Remove drugs/medications (over-the-counter, prescriptions, illicit drugs), all items previously/currently identified as a safety concern.  The family member/significant other verbalizes understanding of the suicide prevention education information provided.  The family member/significant other agrees to remove the items of safety concern listed above.  Pt's mother reports no pt history of SI or past attempts, no past self harm behaviors indicated and no access to guns/firearms in the home.   Smart, Decorey Wahlert LCSWA  03/15/2014, 10:26 AM

## 2014-03-15 NOTE — [facility] Suicide Risk Assessment (Signed)
   Nursing information obtained from:    Demographic factors:    Current Mental Status:    Loss Factors:    Historical Factors:    Risk Reduction Factors:    Total Time spent with patient: 20 minutes  CLINICAL FACTORS:   Currently Psychotic  Psychiatric Specialty Exam: Physical Exam  Constitutional: He is oriented to person, place, and time. He appears well-developed and well-nourished.  HENT:  Head: Normocephalic and atraumatic.  Eyes: Pupils are equal, round, and reactive to light.  Neck: Normal range of motion.  Cardiovascular: Normal rate and regular rhythm.   Respiratory: Effort normal.  GI: Soft.  Musculoskeletal: Normal range of motion.  Neurological: He is alert and oriented to person, place, and time.  Skin: Skin is warm.  Psychiatric: His mood appears anxious. His affect is inappropriate. His speech is delayed. He is withdrawn and actively hallucinating. Thought content is paranoid and delusional. Cognition and memory are normal. He expresses inappropriate judgment. He exhibits a depressed mood. He expresses no homicidal and no suicidal ideation. He is inattentive.    Review of Systems  Constitutional: Negative.   Eyes: Negative.   Respiratory: Negative.   Cardiovascular: Negative.   Genitourinary: Negative.   Musculoskeletal: Negative.   Skin: Negative.        Has facial swelling ,swelling of his eyes ,lips   Neurological: Negative.   Psychiatric/Behavioral: Positive for hallucinations.    Blood pressure 117/76, pulse 85, temperature 99 F (37.2 C), temperature source Oral, resp. rate 17, height  (1.676 m), weight 68.947 kg (152 lb), SpO2 100.00%.Body mass index is 24.55 kg/(m^2).  Please refer to Mental status examination in H&P                                                 Musculoskeletal: Strength & Muscle Tone: within normal limits Gait & Station: normal Patient leans: N/A  COGNITIVE FEATURES THAT CONTRIBUTE TO RISK:   Closed-mindedness Polarized thinking Thought constriction (tunnel vision)    SUICIDE RISK:   Mild:  Suicidal ideation of limited frequency, intensity, duration, and specificity.  There are no identifiable plans, no associated intent, mild dysphoria and related symptoms, good self-control (both objective and subjective assessment), few other risk factors, and identifiable protective factors, including available and accessible social support.  PLAN OF CARE:  I certify that inpatient services furnished can reasonably be expected to improve the patient's condition.  [name] 03/15/2014, 2:32 PM

## 2014-03-15 NOTE — ED Notes (Signed)
Mother at bedside; reports lips do not seem more swollen than usual.

## 2014-03-15 NOTE — Progress Notes (Signed)
D: Pt was guarded with minimal interaction with this Clinical research associate. Pt verbally denied any SI/HI/AVH. He denied any concerns he wished for this writer to address at this time. Pt was isolated to his room this evening. Pt did not wake up for writer to administer his scheduled cogentin. Respirations were even and unlabored. Pt appeared to be in no signs of distress at this time. Pt was previously informed about his upcoming dose cogentin. Pt denied any physical pain.  A: Continued support and availability as needed was extended to this pt. Staff continue to monitor pt with q58min checks.  R: Pt remains safe at this time.
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
## 2014-03-15 NOTE — ED Notes (Signed)
Fresh scrubs given.

## 2014-03-15 NOTE — ED Provider Notes (Signed)
Medical screening examination/treatment/procedure(s) were performed by non-physician practitioner and as supervising physician I was immediately available for consultation/collaboration.   EKG Interpretation None       Raeford Razor, MD 03/15/14 1755

## 2014-03-15 NOTE — ED Notes (Signed)
Pt eating lunch; no needs 

## 2014-03-15 NOTE — Progress Notes (Signed)
Patient ID: [name], male   DOB: 1991/05/08, 23 y.o.   MRN: [id number]  [facility] Group Notes:  (Nursing/MHT/Case Management/Adjunct)  Date:  03/15/2014  Time:  10:05 AM  Type of Therapy:  Nurse Education  Participation Level:  Did Not Attend  Participation Quality:  Did not attend  Affect:  Did not attend  Cognitive:  Did not attend  Insight:  None  Engagement in Group:  None  Modes of Intervention:  Did not attend  Summary of Progress/Problems: Writer spoke to the theme them of the day, which is leisure and lifestyle activity. Focusing primarily on medication compliance, nutrition, and better ways for patients to take better care of themselves.

## 2014-03-15 NOTE — ED Notes (Addendum)
No needs at this time; sitter at bedside. Lunch tray ordered at this time; no signs of distress.

## 2014-03-16 MED ORDER — HALOPERIDOL 1 MG PO TABS
1.0000 mg | ORAL_TABLET | Freq: Once | ORAL | Status: AC
  Administered 2014-03-16: 1 mg via ORAL
  Filled 2014-03-16 (×2): qty 1

## 2014-03-16 MED ORDER — HALOPERIDOL 5 MG PO TABS
5.0000 mg | ORAL_TABLET | Freq: Two times a day (BID) | ORAL | Status: DC
  Administered 2014-03-16 – 2014-03-17 (×3): 5 mg via ORAL
  Filled 2014-03-16 (×6): qty 1

## 2014-03-16 MED ORDER — DIPHENHYDRAMINE HCL 25 MG PO CAPS
25.0000 mg | ORAL_CAPSULE | Freq: Two times a day (BID) | ORAL | Status: DC
  Administered 2014-03-17 (×2): 25 mg via ORAL
  Filled 2014-03-16 (×6): qty 1

## 2014-03-16 MED ORDER — TRAZODONE HCL 50 MG PO TABS
50.0000 mg | ORAL_TABLET | Freq: Every evening | ORAL | Status: DC | PRN
  Filled 2014-03-16: qty 14

## 2014-03-16 MED ORDER — DIPHENHYDRAMINE HCL 25 MG PO CAPS
25.0000 mg | ORAL_CAPSULE | Freq: Once | ORAL | Status: AC
  Administered 2014-03-16: 25 mg via ORAL
  Filled 2014-03-16 (×2): qty 1

## 2014-03-16 NOTE — Progress Notes (Signed)
D: Pt denies SI/HI/AVH. Pt is pleasant and cooperative. Pt plan to "continue with thing" when he gets out to help his situation. Pt stated he will " try to make things better". Pt still appears paranoid and guarded, while forwarding little information.   A: Pt was offered support and encouragement. Pt was given scheduled medications. Pt was encourage to attend groups. Q 15 minute checks were done for safety.   R:Pt attends groups and interacts well with peers and staff. Pt is taking medication. Pt has no complaints at this time.Pt receptive to treatment and safety maintained on unit.

## 2014-03-16 NOTE — [facility] Group Notes (Signed)
[facility] LCSW Aftercare Discharge Planning Group Note   03/16/2014 10:35 AM  Participation Quality:  Minimal   Mood/Affect:  Flat  Depression Rating:  Unknown  Anxiety Rating:  Unknown  Thoughts of Suicide:  No Will you contract for safety?   NA  Current AVH:  Yes  Plan for Discharge/Comments:  [name] attended his first group today.  Minimal participation. States his mother visited last night and it went well.  Smiled.  When asked what he likes to do for fun, he responded "sports" more specifically "football."   Pt is returning home when discharged.    Transportation Means: Family   Supports: Family   [name],[name]

## 2014-03-16 NOTE — Plan of Care (Signed)
Problem: Ineffective individual coping Goal: LTG: Patient will report a decrease in negative feelings Outcome: Not Progressing Pt stated he was feeling better , but appears blunted with flat affect. Goal: STG: Patient will remain free from self harm Outcome: Progressing Pt remaing safe on unit

## 2014-03-16 NOTE — [facility] Group Notes (Signed)
[facility] LCSW Group Therapy  03/16/2014  1:05 PM  Type of Therapy:  Group therapy  Participation Level:  Active  Participation Quality:  Attentive  Affect:  Flat  Cognitive:  Oriented  Insight:  Limited  Engagement in Therapy:  Limited  Modes of Intervention:  Discussion, Socialization  Summary of Progress/Problems:  Chaplain was here to lead a group on theme.  [name] described caring as needing to be understood and respected.  He was unable to name anyone that provides that for him.  "Lots of people do."  He experiences care here through talking.  [name] presents with depressed mood and flat affect. He initially was slow to respond when called upon but toward the end of group, began speaking more freely. He was able to remain engaged in group discussion and actively listen to others.   [employer], LCSWA   03/16/2014 2:03 PM

## 2014-03-16 NOTE — Clinical Social Work Note (Signed)
CSW called pt's mother in attempt to get Medicaid number, as pt's mother stated a medicaid card came for [name] in the mail recently. Pt's mother stated that she is at work and will not have access to card until later this evening. Noone else available at home to provide this information. CSW asked that pt's mother call over the weekend to provide this info to weekend CSW/call Monday and ask for [name] or [name] if not able to reach anyone over the weekend. Pt's mother agreeable to this.   [employer], LCSWA 03/16/2014 11:05 AM

## 2014-03-16 NOTE — Progress Notes (Signed)
[facility] MD Progress Note  03/16/2014 11:20 AM Mario [name]  MRN:  [id number] Subjective: " I am Ok"  Objective: Patient is a 23 year old AAM ,who was brought in for bizarre behavior ,wandering the streets ,noncomplinat on medications,not taking care of his ADLs. He was urinating on himself and was lying on it that he developed a lesion on his legs . He has poor insight in to his conditions and continues to report he is OK . Conversations with him are brief since he answers questions in monosyllables .  Patient used to be quiet functional prior to being diagnosed with schizophrenia a few years ago. 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 His symptoms started after he started using synthetic marijuana. He had an episode when he developed some facial swelling 2/2 to unknown medications. He was on prolixin ,carbamazepine as well as cogentin at that time. All of them has been discontinued and is added to his allergy list.  Per staff patient continues to be withdrawn.   Diagnosis:   DSM5: Primary psychiatric diagnosis:  Schizophrenia ,Multiple episodes ,currently in acute episodes   Secondary Psychiatric diagnosis;  Cannabis use disorder       Total Time spent with patient: 30 minutes    ADL's:  Impaired  Sleep: Fair  Appetite:  Fair  Suicidal Ideation:  Denies ,but was wandering streets and is a danger to self at this time.  Homicidal Ideation:  Denies  AEB (as evidenced by):  Psychiatric Specialty Exam: Physical Exam  Constitutional: He is oriented to person, place, and time. He appears well-developed and well-nourished.  HENT:  Head: Normocephalic and atraumatic.  Neck: Normal range of motion.  Neurological: He is alert and oriented to person, place, and time.  Psychiatric: His mood appears anxious. His affect is blunt. His speech is delayed. He is withdrawn. Thought content is paranoid. Cognition and memory are normal. He expresses inappropriate judgment.     Review of Systems  Constitutional: Negative.   HENT: Negative.   Respiratory: Negative.   Cardiovascular: Negative.   Musculoskeletal: Negative.   Skin:       Has a lesion on his legs-not cooperative with assessing it   Psychiatric/Behavioral: Positive for hallucinations and substance abuse. Negative for suicidal ideas. The patient is nervous/anxious.     Blood pressure 126/74, pulse 78, temperature 97.8 F (36.6 C), temperature source Oral, resp. rate 16, height  (1.676 m), weight 68.947 kg (152 lb), SpO2 99.00%.Body mass index is 24.55 kg/(m^2).  General Appearance: Disheveled  Eye Solicitor::  Fair  Speech:  Slow very brief answers  Volume:  Decreased  Mood:  Anxious  Affect:  Restricted  Thought Process:  Disorganized  Orientation:  Other:  to person and place  Thought Content:  Hallucinations: Auditory came in with talking to self,staring spells,wandering streets,lying in his own urine  Suicidal Thoughts:  No but is a danger to self -since he has been wandering streets ,walking in traffic.  Homicidal Thoughts:  No  Memory:  Immediate;   Fair Recent;   Poor Remote;   Poor  Judgement:  Impaired  Insight:  Lacking  Psychomotor Activity:  Decreased  Concentration:  Poor  Recall:  Poor  Fund of Knowledge:Poor  Language: Fair  Akathisia:  No    AIMS (if indicated):   0  Assets:  Social Support  Sleep:  Number of Hours: 6.75   Musculoskeletal: Strength & Muscle Tone: within normal limits Gait & Station: normal Patient  leans: N/A  Current Medications: Current Facility-Administered Medications  Medication Dose Route Frequency Provider Last Rate Last Dose  . bacitracin ointment 1 application  1 application Topical BID Jennifer L Piepenbrink, PA-C      . diphenhydrAMINE (BENADRYL) capsule 25 mg  25 mg Oral BID Ziair Penson, MD      . diphenhydrAMINE (BENADRYL) injection 50 mg  50 mg Intramuscular Once Arnol Mcgibbon, MD      . haloperidol (HALDOL) tablet 5 mg  5 mg  Oral BID Jomarie Longs, MD      . hydrOXYzine (ATARAX/VISTARIL) tablet 25 mg  25 mg Oral QHS PRN Shuvon Rankin, NP      . magnesium hydroxide (MILK OF MAGNESIA) suspension 30 mL  30 mL Oral Daily PRN Shuvon Rankin, NP      . traZODone (DESYREL) tablet 50 mg  50 mg Oral QHS PRN Jomarie Longs, MD        Lab Results: No results found for this or any previous visit (from the past 48 hour(s)).  Physical Findings: AIMS: Facial and Oral Movements Muscles of Facial Expression: None, normal Lips and Perioral Area: None, normal Jaw: None, normal Tongue: None, normal,Extremity Movements Upper (arms, wrists, hands, fingers): None, normal Lower (legs, knees, ankles, toes): None, normal, Trunk Movements Neck, shoulders, hips: None, normal, Overall Severity Severity of abnormal movements (highest score from questions above): None, normal Incapacitation due to abnormal movements: None, normal Patient's awareness of abnormal movements (rate only patient's report): No Awareness, Dental Status Current problems with teeth and/or dentures?: No Does patient usually wear dentures?: No  CIWA:    COWS:     Treatment Plan Summary: Daily contact with patient to assess and evaluate symptoms and progress in treatment Medication management  Plan: Will continue treatment and stabilization. Patient to be given haldol 1 mg ,will monitor for any allergic reactions ,face swelling. Will schedule haldol 5 mg po bid, to be given if patient is OK with the small dose of haldol. Will add Benadryl 25 mg po bid for EPS. Will start Trazodone 50 mg po qhs prn for sleep. CSW will work on disposition. Patient to participate in milieu/attend groups.   Medical Decision Making Problem Points:  Established problem, worsening (2) and Review of psycho-social stressors (1) Data Points:  Order Aims Assessment (2) Review and summation of old records (2) Review of medication regiment & side effects (2) Review of new  medications or change in dosage (2)  I certify that inpatient services furnished can reasonably be expected to improve the patient's condition.   Detta Mellin 03/16/2014, 11:20 AM

## 2014-03-16 NOTE — Progress Notes (Signed)
[facility] Group Notes:  (Nursing/MHT/Case Management/Adjunct)  Date:  03/16/2014  Time:  9:00 PM  Type of Therapy:  Psychoeducational Skills  Participation Level:  Active  Participation Quality:  Attentive  Affect:  Flat  Cognitive:  Appropriate  Insight:  Appropriate  Engagement in Group:  Developing/Improving  Modes of Intervention:  Education  Summary of Progress/Problems: The patient described his day as having been "pretty good" overall despite the fact that he was not discharged today. He states that he was not happy about having his discharge cancelled but is agreeable to going home on Monday. Patient states that he occupied his time by trying to stay relaxed. In terms of the theme for the day, his relapse prevention will be to do things by himself. The patient had difficulty answering that particular question.   Tricha Ruggirello S 03/16/2014, 9:00 PM

## 2014-03-17 DIAGNOSIS — F209 Schizophrenia, unspecified: Secondary | ICD-10-CM

## 2014-03-17 DIAGNOSIS — F1210 Cannabis abuse, uncomplicated: Secondary | ICD-10-CM

## 2014-03-17 MED ORDER — HALOPERIDOL 5 MG PO TABS
5.0000 mg | ORAL_TABLET | Freq: Two times a day (BID) | ORAL | Status: DC
  Administered 2014-03-18 – 2014-03-20 (×5): 5 mg via ORAL
  Filled 2014-03-17 (×9): qty 1

## 2014-03-17 MED ORDER — DIPHENHYDRAMINE HCL 25 MG PO CAPS
25.0000 mg | ORAL_CAPSULE | Freq: Two times a day (BID) | ORAL | Status: DC
  Administered 2014-03-18 – 2014-03-21 (×6): 25 mg via ORAL
  Filled 2014-03-17 (×9): qty 1

## 2014-03-17 NOTE — Progress Notes (Addendum)
Psychoeducational Group Note  Date:  03/17/2014 Time:  0915  Group Topic/Focus:  Making Healthy Choices:   The focus of this group is to help patients identify negative/unhealthy choices they were using prior to admission and identify positive/healthier coping strategies to replace them upon discharge.  & Self-Inventory Group  Participation Level: Did Not Attend  Participation Quality:  Not Applicable  Affect:  Not Applicable  Cognitive:  Not Applicable  Insight:  Not Applicable  Engagement in Group: Not Applicable  Additional Comments:  Patient did not attend group. Patient wouldn't give RN specific reason for not attending.  Nestor Ramp [facility] 03/17/2014, 9:49 AM

## 2014-03-17 NOTE — [facility] Group Notes (Signed)
[facility] Group Notes: (Clinical Social Work)   03/17/2014      Type of Therapy:  Group Therapy   Participation Level:  Did Not Attend    Ambrose Mantle, LCSW 03/17/2014, 1:09 PM

## 2014-03-17 NOTE — Progress Notes (Signed)
Psychoeducational Group Note  Date:  03/17/2014 Time:  2104  Group Topic/Focus:  Wrap-Up Group:   The focus of this group is to help patients review their daily goal of treatment and discuss progress on daily workbooks.  Participation Level: Did Not Attend  Participation Quality:  Not Applicable  Affect:  Not Applicable  Cognitive:  Not Applicable  Insight:  Not Applicable  Engagement in Group: Not Applicable  Additional Comments:  The patient did not attend group this evening.   Hazle Coca S 03/17/2014, 9:04 PM

## 2014-03-17 NOTE — Progress Notes (Signed)
Patient ID: Mario [name], male   DOB: 1990/10/15, 23 y.o.   MRN: [id number] [facility] MD Progress Note  03/17/2014 12:44 PM YANNIS BROCE  MRN:  [id number] Subjective: " I feel a lot better but I really need to switch my benadryl and haldol closer to bed time so I'm not so tired."   Objective: Pt seen and chart reviewed. Pt denies SI, HI, and AVH, contracts for safety. Pt reports that his medication is working but that he is very tired on the current regimen and that he wants to move the administration times.    Diagnosis:   DSM5: Primary psychiatric diagnosis:  Schizophrenia ,Multiple episodes ,currently in acute episodes   Secondary Psychiatric diagnosis;  Cannabis use disorder       Total Time spent with patient: 30 minutes    ADL's:  Impaired  Sleep: Good  Appetite:  Good  Suicidal Ideation:  Denies ,but was wandering streets and is a danger to self at this time.  Homicidal Ideation:  Denies  AEB (as evidenced by):  Psychiatric Specialty Exam: Physical Exam  Constitutional: He is oriented to person, place, and time. He appears well-developed and well-nourished.  HENT:  Head: Normocephalic and atraumatic.  Neck: Normal range of motion.  Neurological: He is alert and oriented to person, place, and time.    Review of Systems  Constitutional: Negative.   HENT: Negative.   Eyes: Negative.   Respiratory: Negative.   Cardiovascular: Negative.   Gastrointestinal: Negative.   Genitourinary: Negative.   Musculoskeletal: Negative.   Skin: Negative.        Has a lesion on his legs-not cooperative with assessing it   Neurological: Negative.   Endo/Heme/Allergies: Negative.   Psychiatric/Behavioral: Positive for substance abuse. Negative for suicidal ideas. The patient is nervous/anxious.     Blood pressure 145/112, pulse 90, temperature 98.1 F (36.7 C), temperature source Oral, resp. rate 16, height  (1.676 m), weight 68.947 kg (152 lb), SpO2 99.00%.Body  mass index is 24.55 kg/(m^2).  General Appearance: Disheveled  Eye Solicitor::  Fair  Speech:  Clear and Coherent  Volume:  Normal  Mood:  Depressed  Affect:  Appropriate  Thought Process:  Coherent and Goal Directed  Orientation:  Other:  to person and place  Thought Content:  WDL   Suicidal Thoughts:  No   Homicidal Thoughts:  No  Memory:  Immediate;   Fair Recent;   Poor Remote;   Poor  Judgement:  Impaired  Insight:  Lacking  Psychomotor Activity:  Decreased  Concentration:  Poor  Recall:  Poor  Fund of Knowledge:Poor  Language: Fair  Akathisia:  No    AIMS (if indicated):   0  Assets:  Social Support  Sleep:  Number of Hours: 6.75   Musculoskeletal: Strength & Muscle Tone: within normal limits Gait & Station: normal Patient leans: N/A  Current Medications: Current Facility-Administered Medications  Medication Dose Route Frequency Provider Last Rate Last Dose  . bacitracin ointment 1 application  1 application Topical BID Jennifer L Piepenbrink, PA-C      . diphenhydrAMINE (BENADRYL) capsule 25 mg  25 mg Oral BID Jomarie Longs, MD   25 mg at 03/17/14 0814  . diphenhydrAMINE (BENADRYL) injection 50 mg  50 mg Intramuscular Once Saramma Eappen, MD      . haloperidol (HALDOL) tablet 5 mg  5 mg Oral BID Jomarie Longs, MD   5 mg at 03/17/14 0814  . magnesium hydroxide (MILK OF MAGNESIA) suspension 30 mL  30 mL Oral Daily PRN Shuvon Rankin, NP      . traZODone (DESYREL) tablet 50 mg  50 mg Oral QHS PRN Jomarie Longs, MD        Lab Results: No results found for this or any previous visit (from the past 48 hour(s)).  Physical Findings: AIMS: Facial and Oral Movements Muscles of Facial Expression: None, normal Lips and Perioral Area: None, normal Jaw: None, normal Tongue: None, normal,Extremity Movements Upper (arms, wrists, hands, fingers): None, normal Lower (legs, knees, ankles, toes): None, normal, Trunk Movements Neck, shoulders, hips: None, normal, Overall  Severity Severity of abnormal movements (highest score from questions above): None, normal Incapacitation due to abnormal movements: None, normal Patient's awareness of abnormal movements (rate only patient's report): No Awareness, Dental Status Current problems with teeth and/or dentures?: No Does patient usually wear dentures?: No  CIWA:    COWS:     Treatment Plan Summary: Daily contact with patient to assess and evaluate symptoms and progress in treatment Medication management  Plan: Will continue treatment and stabilization.  Continue schedule haldol 5 mg po bid, to be given if patient is OK with the small dose of haldol. Continue add Benadryl 25 mg po bid for EPS.  -Moved Haldol and Benadryl near bedtime due to pt lethargy.  Continue Trazodone 50 mg po qhs prn for sleep. CSW will work on disposition. Patient to participate in milieu/attend groups.   Medical Decision Making Problem Points:  Established problem, worsening (2) and Review of psycho-social stressors (1) Data Points:  Order Aims Assessment (2) Review and summation of old records (2) Review of medication regiment & side effects (2) Review of new medications or change in dosage (2)  I certify that inpatient services furnished can reasonably be expected to improve the patient's condition.   Beau Fanny, FNP-BC 03/17/2014, 12:44 PM I agreed with findings and treatment plan of this patient

## 2014-03-18 NOTE — Progress Notes (Signed)
Patient ID: Mario [name], male   DOB: 07-18-1990, 23 y.o.   MRN: [id number] [facility] MD Progress Note  03/18/2014 3:00 PM Mario [name]  MRN:  [id number] Subjective: "I feel better today. I'm feeling fine, really."  Objective: Pt seen and chart reviewed. Pt denies SI, HI, and AVH, contracts for safety. Pt reports that his medication is working but that he is very tired on the current regimen and that he wants to move the administration times. Per nursing report, pt wet the bed again and had food all over his glasses and face. Pt is reportedly unaware of his hygiene and presentation and remains disheveled. During assessment, pt was well-groomed but had apparently recently taken a shower.    Diagnosis:   DSM5: Primary psychiatric diagnosis:  Schizophrenia ,Multiple episodes ,currently in acute episodes   Secondary Psychiatric diagnosis;  Cannabis use disorder   Total Time spent with patient: 30 minutes  ADL's:  Impaired  Sleep: Good  Appetite:  Good  Suicidal Ideation:  Denies ,but was wandering streets and is a danger to self at this time.  Homicidal Ideation:  Denies  AEB (as evidenced by):  Psychiatric Specialty Exam: Physical Exam  Constitutional: He is oriented to person, place, and time. He appears well-developed and well-nourished.  HENT:  Head: Normocephalic and atraumatic.  Neck: Normal range of motion.  Neurological: He is alert and oriented to person, place, and time.    Review of Systems  Constitutional: Negative.   HENT: Negative.   Eyes: Negative.   Respiratory: Negative.   Cardiovascular: Negative.   Gastrointestinal: Negative.   Genitourinary: Negative.   Musculoskeletal: Negative.   Skin: Negative.        Has a lesion on his legs-not cooperative with assessing it   Neurological: Negative.   Endo/Heme/Allergies: Negative.   Psychiatric/Behavioral: Positive for substance abuse. Negative for suicidal ideas. The patient is nervous/anxious.     Blood  pressure 135/85, pulse 75, temperature 98.1 F (36.7 C), temperature source Oral, resp. rate 16, height  (1.676 m), weight 68.947 kg (152 lb), SpO2 99.00%.Body mass index is 24.55 kg/(m^2).  General Appearance: Disheveled  Eye Solicitor::  Fair  Speech:  Clear and Coherent  Volume:  Normal  Mood:  Depressed  Affect:  Appropriate  Thought Process:  Coherent and Goal Directed  Orientation:  Other:  to person and place  Thought Content:  WDL   Suicidal Thoughts:  No   Homicidal Thoughts:  No  Memory:  Immediate;   Fair Recent;   Poor Remote;   Poor  Judgement:  Impaired  Insight:  Lacking  Psychomotor Activity:  Decreased  Concentration:  Poor  Recall:  Poor  Fund of Knowledge:Poor  Language: Fair  Akathisia:  No    AIMS (if indicated):   0  Assets:  Social Support  Sleep:  Number of Hours: 6.25   Musculoskeletal: Strength & Muscle Tone: within normal limits Gait & Station: normal Patient leans: N/A  Current Medications: Current Facility-Administered Medications  Medication Dose Route Frequency Provider Last Rate Last Dose  . bacitracin ointment 1 application  1 application Topical BID Lise Auer Piepenbrink, PA-C   1 application at 03/18/14 0802  . diphenhydrAMINE (BENADRYL) capsule 25 mg  25 mg Oral BID Beau Fanny, FNP   25 mg at 03/18/14 0802  . diphenhydrAMINE (BENADRYL) injection 50 mg  50 mg Intramuscular Once Saramma Eappen, MD      . haloperidol (HALDOL) tablet 5 mg  5 mg Oral  BID Beau Fanny, FNP   5 mg at 03/18/14 1610  . magnesium hydroxide (MILK OF MAGNESIA) suspension 30 mL  30 mL Oral Daily PRN Shuvon Rankin, NP      . traZODone (DESYREL) tablet 50 mg  50 mg Oral QHS PRN Jomarie Longs, MD        Lab Results: No results found for this or any previous visit (from the past 48 hour(s)).  Physical Findings: AIMS: Facial and Oral Movements Muscles of Facial Expression: None, normal Lips and Perioral Area: None, normal Jaw: None, normal Tongue: None,  normal,Extremity Movements Upper (arms, wrists, hands, fingers): None, normal Lower (legs, knees, ankles, toes): None, normal, Trunk Movements Neck, shoulders, hips: None, normal, Overall Severity Severity of abnormal movements (highest score from questions above): None, normal Incapacitation due to abnormal movements: None, normal Patient's awareness of abnormal movements (rate only patient's report): No Awareness, Dental Status Current problems with teeth and/or dentures?: No Does patient usually wear dentures?: No  CIWA:    COWS:     Treatment Plan Summary: Daily contact with patient to assess and evaluate symptoms and progress in treatment Medication management  Plan: Will continue treatment and stabilization. Continue schedule haldol 5 mg po bid, to be given if patient is OK with the small dose of haldol. Add Benadryl 25 mg po bid for EPS.  -Moved Haldol and Benadryl near bedtime due to pt lethargy.  Continue Trazodone 50 mg po qhs prn for sleep.  CSW will work on disposition.  Patient to participate in milieu/attend groups.    Medical Decision Making Problem Points:  Established problem, stable/improving (1) and Review of psycho-social stressors (1) Data Points:  Review of medication regiment & side effects (2)  I certify that inpatient services furnished can reasonably be expected to improve the patient's condition.   Beau Fanny, FNP-BC 03/18/2014, 3:00 PM I agreed with findings and treatment plan of this patient

## 2014-03-18 NOTE — Progress Notes (Signed)
Nursing 1:1 note D:Pt observed sleeping in bed with eyes closed. RR even and unlabored. No distress noted. A: 1:1 observation continues for safety  R: pt remains safe  

## 2014-03-18 NOTE — ED Provider Notes (Signed)
Medical screening examination/treatment/procedure(s) were performed by non-physician practitioner and as supervising physician I was immediately available for consultation/collaboration.   EKG Interpretation None       Antwion Carpenter R. Kadynce Bonds, MD 03/18/14 2311 

## 2014-03-18 NOTE — [facility] Group Notes (Signed)
[facility] Group Notes: (Clinical Social Work)   03/18/2014      Type of Therapy:  Group Therapy   Participation Level:  Did Not Attend    Ambrose Mantle, LCSW 03/18/2014, 12:07 PM

## 2014-03-18 NOTE — Progress Notes (Addendum)
Pt presented to the med window with food all over his mouth and on his glasses. He was wearing a clean shirt and told the nurse he had already showered. Pt took his pills then took them out of his mouth placed them back in and started to chew them. When the nurse tried to ask the pt about wetting his bed last pm he stated,"I have to go now everything is fine." Pt denies Si or HI and still continues to be in a semi catonic state w/o any interaction with any of the pts or staff. His bed was changed by the nightshift nurse . 2pm-Pt is in the dayroom eating a snack while the other pts are watching a football game.

## 2014-03-18 NOTE — Progress Notes (Signed)
Pt was resistant to care in regards to dressing to leg this evening. Pt was not willing to let staff look at or to change bandages on wound. Staff attempted to educate pt on importance of bandage change and keeping it clean. Pt kept repeating "it's ok". Staff was able to get pt to remove bandage and bring to staff but still refused staff to look at the area. Pt reports no pain, itching, or problem associated with the area.

## 2014-03-18 NOTE — Progress Notes (Signed)
Adult Psychoeducational Group Note  Date:  03/18/2014 Time:  9:14 PM  Group Topic/Focus:  Wrap-Up Group:   The focus of this group is to help patients review their daily goal of treatment and discuss progress on daily workbooks.  Participation Level:  Active  Participation Quality:  Appropriate  Affect:  Appropriate  Cognitive:  Appropriate  Insight: Appropriate  Engagement in Group:  Engaged  Modes of Intervention:  Discussion  Additional Comments:  Pt attended wrap-up group this evening. Pt participate in group with peers.   Mario [name] A 03/18/2014, 9:14 PM

## 2014-03-18 NOTE — [facility] Group Notes (Signed)
[facility] Group Notes:  Self inventory  Date:  03/18/2014  Time:  0930  Type of Therapy:  Nurse Education  Participation Level:  Did Not Attend  Participation Quality:  Inattentive  Affect:  Flat  Cognitive:  Lacking  Insight:  None  Engagement in Group:  None  Modes of Intervention:  Discussion and Limit-setting  Summary of Progress/Problems:  Nicole Cella 03/18/2014, 10:22 AM

## 2014-03-18 NOTE — [facility] Group Notes (Signed)
[facility] Group Notes:  Healthy coping skills  Date:  03/18/2014  Time:  10:24 AM  Type of Therapy:  Nurse Education  Participation Level:  Did Not Attend  Participation Quality:  Attentive and Inattentive  Affect:  Flat  Cognitive:  Lacking  Insight:  None  Engagement in Group:  None  Modes of Intervention:  Discussion  Summary of Progress/Problems:  Nicole Cella 03/18/2014, 10:24 AM

## 2014-03-18 NOTE — Plan of Care (Signed)
Problem: Diagnosis: Increased Risk For Suicide Attempt Goal: STG-Patient Will Attend All Groups On The Unit Outcome: Not Progressing Pt did not attend group 9/12 evening

## 2014-03-19 DIAGNOSIS — F201 Disorganized schizophrenia: Secondary | ICD-10-CM

## 2014-03-19 MED ORDER — LAMOTRIGINE 25 MG PO TABS
25.0000 mg | ORAL_TABLET | Freq: Two times a day (BID) | ORAL | Status: DC
  Administered 2014-03-20 – 2014-03-22 (×5): 25 mg via ORAL
  Filled 2014-03-19 (×8): qty 1

## 2014-03-19 NOTE — Progress Notes (Signed)
Patient ID: Mario [name], male   DOB: 1990/11/01, 23 y.o.   MRN: [id number] [facility] MD Progress Note  03/19/2014 1:20 PM GERGORY BIELLO  MRN:  [id number] Subjective: "I am OK.  Objective: Pt seen and chart reviewed. Pt found in bed,continues to lack insight in to his diagnosis. He  denies SI, HI, and AVH, contracts for safety. Pt continues to be disorganized ,disheveled ,not taking care of his ADLs . Patient is compliant on medications.  Per staff ,patient continues to urinate on self.He is withdrawn ,disheveled.He is compliant on medications.  Diagnosis:   DSM5: Primary psychiatric diagnosis:  Schizophrenia ,Multiple episodes ,currently in acute episodes   Secondary Psychiatric diagnosis;  Cannabis use disorder   Total Time spent with patient: 30 minutes  ADL's:  Impaired  Sleep: Good  Appetite:  Good  Suicidal Ideation:  Denies ,but was wandering streets and is a danger to self at this time.  Homicidal Ideation:  Denies  AEB (as evidenced by):  Psychiatric Specialty Exam: Physical Exam  Constitutional: He is oriented to person, place, and time. He appears well-developed and well-nourished.  HENT:  Head: Normocephalic and atraumatic.  Neck: Normal range of motion.  Neurological: He is alert and oriented to person, place, and time.    Review of Systems  Constitutional: Negative.   HENT: Negative.   Eyes: Negative.   Respiratory: Negative.   Cardiovascular: Negative.   Gastrointestinal: Negative.   Genitourinary: Negative.   Musculoskeletal: Negative.   Skin: Negative.        Has a lesion on his legs-not cooperative with assessing it per nursing staff -healing  Neurological: Negative.   Endo/Heme/Allergies: Negative.   Psychiatric/Behavioral: Positive for substance abuse. Negative for suicidal ideas (but continues to be disorganized ,has beenw andering streets before coming in to hospital). The patient is nervous/anxious.     Blood pressure 120/81, pulse 88,  temperature 97.5 F (36.4 C), temperature source Oral, resp. rate 16, height  (1.676 m), weight 68.947 kg (152 lb), SpO2 99.00%.Body mass index is 24.55 kg/(m^2).  General Appearance: Disheveled,continues to urinate on self off and on.  Eye Contact::  Fair  Speech:  non -spontaneous -speaks in monosyllables  Volume:  Normal  Mood:  Depressed  Affect:  Appropriate  Thought Process:  Coherent  Orientation:  Other:  to person and place  Thought Content:  WDL   Suicidal Thoughts:  No ,but was wandering the streets ,and laying in his own urine before coming in to hospital  Homicidal Thoughts:  No  Memory:  Immediate;   Fair Recent;   Poor Remote;   Poor  Judgement:  Impaired  Insight:  Lacking  Psychomotor Activity:  Decreased  Concentration:  Poor  Recall:  Poor  Fund of Knowledge:Poor  Language: Fair  Akathisia:  No    AIMS (if indicated):   0  Assets:  Social Support  Sleep:  Number of Hours: 6.5   Musculoskeletal: Strength & Muscle Tone: within normal limits Gait & Station: normal Patient leans: N/A  Current Medications: Current Facility-Administered Medications  Medication Dose Route Frequency Provider Last Rate Last Dose  . bacitracin ointment 1 application  1 application Topical BID Lise Auer Piepenbrink, PA-C   1 application at 03/18/14 0802  . diphenhydrAMINE (BENADRYL) capsule 25 mg  25 mg Oral BID Beau Fanny, FNP   25 mg at 03/19/14 0915  . diphenhydrAMINE (BENADRYL) injection 50 mg  50 mg Intramuscular Once Jomarie Longs, MD      .  haloperidol (HALDOL) tablet 5 mg  5 mg Oral BID Beau Fanny, FNP   5 mg at 03/19/14 0915  . lamoTRIgine (LAMICTAL) tablet 25 mg  25 mg Oral BID Indalecio Malmstrom, MD      . magnesium hydroxide (MILK OF MAGNESIA) suspension 30 mL  30 mL Oral Daily PRN Shuvon Rankin, NP      . traZODone (DESYREL) tablet 50 mg  50 mg Oral QHS PRN Jomarie Longs, MD        Lab Results: No results found for this or any previous visit (from the  past 48 hour(s)).  Physical Findings: AIMS: Facial and Oral Movements Muscles of Facial Expression: None, normal Lips and Perioral Area: None, normal Jaw: None, normal Tongue: None, normal,Extremity Movements Upper (arms, wrists, hands, fingers): None, normal Lower (legs, knees, ankles, toes): None, normal, Trunk Movements Neck, shoulders, hips: None, normal, Overall Severity Severity of abnormal movements (highest score from questions above): None, normal Incapacitation due to abnormal movements: None, normal Patient's awareness of abnormal movements (rate only patient's report): No Awareness, Dental Status Current problems with teeth and/or dentures?: No Does patient usually wear dentures?: No  CIWA:    COWS:     Treatment Plan Summary: Daily contact with patient to assess and evaluate symptoms and progress in treatment Medication management  Plan: Patient continues to be withdrawn ,disorganized,does not take care of ADLs ,urinates on self .Patient has no insight in to his diagnosis.Talks in monosyllables. Will continue treatment and stabilization. Continue schedule haldol 5 mg po bid, to be given if patient is OK with the small dose of haldol. Add Benadryl 25 mg po bid for EPS.  -Moved Haldol and Benadryl near bedtime due to pt lethargy.  Will add Lamictal 25 mg po bid for affective sx. Continue Trazodone 50 mg po qhs prn for sleep.  CSW will work on disposition.  Patient to participate in milieu/attend groups.    Medical Decision Making Problem Points:  Established problem, stable/improving (1) and Review of psycho-social stressors (1) Data Points:  Review of medication regiment & side effects (2)  I certify that inpatient services furnished can reasonably be expected to improve the patient's condition.   Braylin Formby, MD 03/19/2014, 1:20 PM

## 2014-03-19 NOTE — [facility] Group Notes (Signed)
[facility] LCSW Group Therapy  03/19/2014 1:15 pm  Type of Therapy: Process Group Therapy  Participation Level:  Invited.  Chose to not attend  Summary of Progress/Problems: Today's group addressed the issue of overcoming obstacles.  Patients were asked to identify their biggest obstacle post d/c that stands in the way of their on-going success, and then problem solve as to how to manage this.    Ida Rogue 03/19/2014   4:32 PM

## 2014-03-19 NOTE — Progress Notes (Signed)
Assumed care of pt at 0300.  Pt is resting in room in no distress.  Respirations even, unlabored, WNL.  Will continue to monitor and assess for safety.

## 2014-03-19 NOTE — Progress Notes (Signed)
Patient ID: Mario [name], male   DOB: Oct 09, 1990, 23 y.o.   MRN: [id number]  D: Pt. Denies SI/HI and A/V Hallucinations. Patient does not report any pain or discomfort at this time. Patient is seen in bed on first approach. Patient's mood depressed and affect is flat. Writer encouraged patient to show writer his wound but patient kept repeating, "I am fine." Patient appears to be looking through Clinical research associate when speaking. Patient did not fill out self inventory although encouraged. Writer tried to assess patient's leg and apply ointment/dressing but patient refused.   A: Support and encouragement provided to the patient to perform ADL's, attend groups, and work with treatment team. Scheduled medications administered to patient.  R: Patient is minimal and isolative with Press photographer. Patient is rarely seen in the milieu. Q15 minute checks are maintained for safety.

## 2014-03-19 NOTE — Plan of Care (Signed)
Problem: Ineffective individual coping Goal: STG-Increase in ability to manage activities of daily living Outcome: Not Progressing Patient is not doing ADL's as necessary for her hygiene. Patient is still disheveled and malodorous. Patient refuses to allow staff to check patient's wound.

## 2014-03-20 MED ORDER — LORAZEPAM 2 MG/ML IJ SOLN: 2.0000 mg | Freq: Once | INTRAMUSCULAR | Status: DC

## 2014-03-20 MED ORDER — HALOPERIDOL 5 MG PO TABS
10.0000 mg | ORAL_TABLET | Freq: Every evening | ORAL | Status: DC
  Administered 2014-03-20: 10 mg via ORAL
  Filled 2014-03-20 (×2): qty 2

## 2014-03-20 MED ORDER — BUPROPION HCL ER (XL) 150 MG PO TB24
150.0000 mg | ORAL_TABLET | Freq: Every day | ORAL | Status: DC
  Administered 2014-03-20 – 2014-03-30 (×11): 150 mg via ORAL
  Filled 2014-03-20 (×13): qty 1
  Filled 2014-03-20: qty 14

## 2014-03-20 MED ORDER — HALOPERIDOL 5 MG PO TABS
5.0000 mg | ORAL_TABLET | Freq: Every day | ORAL | Status: DC
  Administered 2014-03-21: 5 mg via ORAL
  Filled 2014-03-20 (×2): qty 1

## 2014-03-20 NOTE — [facility] Group Notes (Signed)
[facility] LCSW Group Therapy  03/20/2014 11:27 AM  Type of Therapy:  Group Therapy  Participation Level:  Minimal  Participation Quality:  Attentive  Affect:  Flat  Cognitive:  Lacking  Insight:  Limited  Engagement in Therapy:  Poor  Modes of Intervention:  Confrontation, Discussion, Education, Exploration, Limit-setting, Orientation, Rapport Building, Dance movement psychotherapist, Socialization and Support  Summary of Progress/Problems: Feelings around Diagnosis: Kameran was attentive during today's processing group but was reluctant to participate in group discussion. He continues to respond in one word answers and demonstrates difficulty in conveying his thoughts. Trystan shows some progress in the group setting with limited insight at this time. He stated that his family treats him the same even with a MH diagnosis and shared that they are able to understand him and support him better because of his diagnosis.   Smart, Jameson Tormey LCSWA 03/20/2014, 11:27 AM

## 2014-03-20 NOTE — Progress Notes (Signed)
Patient ID: Mario [name], male   DOB: 19-Oct-1990, 23 y.o.   MRN: [id number] D: client in bed, eyes closed, respirations even. A: Writer observed for s/s of distress. Staff will monitor q3min for safety. R: Client is safe on the unit, respirations unlabored, no distress noted.

## 2014-03-20 NOTE — Progress Notes (Signed)
Patient ID: Mario [name], male   DOB: 1990/10/23, 23 y.o.   MRN: [id number] [facility] MD Progress Note  03/20/2014 12:51 PM Mario [name]  MRN:  [id number] Subjective: "I am feeling fine" ,I do not have any problems ,when can I go home ?.  Objective: Pt seen and chart reviewed. Pt found in bed,continues to lack insight in to his diagnosis. Patient is fixed on being discharged and wants to go home today. He  denies SI, HI, and AVH, contracts for safety. Pt continues to be disorganized ,disheveled ,not taking care of his ADLs . Patient is compliant on medications.  Per staff ,patient refuses to take care of self or take a shower.He is withdrawn ,disheveled.He is compliant on medications.  Diagnosis:   DSM5: Primary psychiatric diagnosis:  Schizophrenia ,Multiple episodes ,currently in acute episodes   Secondary Psychiatric diagnosis;  Cannabis use disorder   Total Time spent with patient: 30 minutes  ADL's:  Impaired  Sleep: Good  Appetite:  Good  Suicidal Ideation:  Denies ,but was wandering streets and is a danger to self at this time.  Homicidal Ideation:  Denies   Psychiatric Specialty Exam: Physical Exam  Constitutional: He is oriented to person, place, and time. He appears well-developed and well-nourished.  HENT:  Head: Normocephalic and atraumatic.  Neck: Normal range of motion.  Neurological: He is alert and oriented to person, place, and time.    Review of Systems  Constitutional: Negative.   HENT: Negative.   Eyes: Negative.   Respiratory: Negative.   Cardiovascular: Negative.   Gastrointestinal: Negative.   Genitourinary: Negative.   Musculoskeletal: Negative.   Skin: Negative.        Has a lesion on his legs-not cooperative with assessing it per nursing staff -healing  Neurological: Negative.   Endo/Heme/Allergies: Negative.   Psychiatric/Behavioral: Positive for substance abuse. Negative for suicidal ideas (but continues to be disorganized ,has beenw  andering streets before coming in to hospital). The patient is nervous/anxious.     Blood pressure 134/83, pulse 92, temperature 98 F (36.7 C), temperature source Oral, resp. rate 18, height  (1.676 m), weight 68.947 kg (152 lb), SpO2 99.00%.Body mass index is 24.55 kg/(m^2).  General Appearance: Disheveled,not taking showers,not changing clothes  Eye Contact::  Fair  Speech:  non -spontaneous -speaks in monosyllables  Volume:  Normal  Mood:  Depressed  Affect:  Restricted  Thought Process:  Coherent  Orientation:  Other:  to person and place  Thought Content:  WDL   Suicidal Thoughts:  No ,but was wandering the streets ,and laying in his own urine before coming in to hospital  Homicidal Thoughts:  No  Memory:  Immediate;   Fair Recent;   Poor Remote;   Poor  Judgement:  Impaired  Insight:  Lacking  Psychomotor Activity:  Decreased  Concentration:  Poor  Recall:  Poor  Fund of Knowledge:Poor  Language: Fair  Akathisia:  No    AIMS (if indicated):   0  Assets:  Social Support  Sleep:  Number of Hours: 6.75   Musculoskeletal: Strength & Muscle Tone: within normal limits Gait & Station: normal Patient leans: N/A  Current Medications: Current Facility-Administered Medications  Medication Dose Route Frequency Provider Last Rate Last Dose  . bacitracin ointment 1 application  1 application Topical BID Lise Auer Piepenbrink, PA-C   1 application at 03/18/14 0802  . buPROPion (WELLBUTRIN XL) 24 hr tablet 150 mg  150 mg Oral Daily Jomarie Longs, MD      .  diphenhydrAMINE (BENADRYL) capsule 25 mg  25 mg Oral BID Beau Fanny, FNP   25 mg at 03/20/14 9604  . diphenhydrAMINE (BENADRYL) injection 50 mg  50 mg Intramuscular Once Millicent Blazejewski, MD      . haloperidol (HALDOL) tablet 5 mg  5 mg Oral BID Beau Fanny, FNP   5 mg at 03/20/14 0943  . lamoTRIgine (LAMICTAL) tablet 25 mg  25 mg Oral BID Jomarie Longs, MD   25 mg at 03/20/14 0943  . LORazepam (ATIVAN) injection 2  mg  2 mg Intramuscular Once Spencer E Simon, PA-C      . magnesium hydroxide (MILK OF MAGNESIA) suspension 30 mL  30 mL Oral Daily PRN Shuvon Rankin, NP      . traZODone (DESYREL) tablet 50 mg  50 mg Oral QHS PRN Jomarie Longs, MD        Lab Results: No results found for this or any previous visit (from the past 48 hour(s)).  Physical Findings: AIMS: Facial and Oral Movements Muscles of Facial Expression: None, normal Lips and Perioral Area: None, normal Jaw: None, normal Tongue: None, normal,Extremity Movements Upper (arms, wrists, hands, fingers): None, normal Lower (legs, knees, ankles, toes): None, normal, Trunk Movements Neck, shoulders, hips: None, normal, Overall Severity Severity of abnormal movements (highest score from questions above): None, normal Incapacitation due to abnormal movements: None, normal Patient's awareness of abnormal movements (rate only patient's report): No Awareness, Dental Status Current problems with teeth and/or dentures?: No Does patient usually wear dentures?: No  CIWA:    COWS:     Treatment Plan Summary: Daily contact with patient to assess and evaluate symptoms and progress in treatment Medication management  Plan: Patient continues to be withdrawn ,disorganized,does not take care of ADLs .Patient has no insight in to his diagnosis.Talks in monosyllables. Will continue treatment and stabilization. Increase haldol to 15 mg po daily. Continue Benadryl 25 mg po bid for EPS.  Continue Lamictal 25 mg po bid for affective sx. Will add Wellbutrin XL 150 mg for motivation as well as energy. Continue Trazodone 50 mg po qhs prn for sleep.  Patient to be encouraged to take care of self and change his clothes. CSW will work on disposition.  Patient to participate in milieu/attend groups.    Medical Decision Making Problem Points:  Established problem, stable/improving (1) and Review of psycho-social stressors (1) Data Points:  Review of  medication regiment & side effects (2) Review of new medications or change in dosage (2)  I certify that inpatient services furnished can reasonably be expected to improve the patient's condition.   Mario Palmatier, MD 03/20/2014, 12:51 PM

## 2014-03-20 NOTE — Plan of Care (Signed)
Problem: Diagnosis: Increased Risk For Suicide Attempt Goal: LTG-Patient Will Show Positive Response to Medication LTG (by discharge) : Patient will show positive response to medication and will participate in the development of the discharge plan.  Outcome: Not Progressing Pt continues to be paranoid and suspicious on the unit. Goal: LTG-Patient Will Report Improvement in Psychotic Symptoms LTG (by discharge) : Patient will report improvement in psychotic symptoms.  Outcome: Not Progressing Pt continues to signs of psychosis. Pt is paranoid to take his medications and watchful on the unit. Goal: STG-Patient Will Attend All Groups On The Unit Outcome: Progressing Pt attended some groups today with minimal participation  Goal: STG-Patient Will Report Suicidal Feelings to Staff Outcome: Progressing Pt denies suicidal thoughts Goal: STG-Patient Will Comply With Medication Regime Outcome: Not Progressing Pt is paranoid when taking medication. He spits the medicine out before taking.

## 2014-03-20 NOTE — Progress Notes (Signed)
Patient ID: Mario [name], male   DOB: December 05, 1990, 23 y.o.   MRN: [id number]  D:  Pt was more approachable this morning than yesterday. Pt informed the writer that he will be leaving today and believes his mother is going to pick him up after discharge.  Stated that he feels prepared to leave and is "glad" to be going home. Pt was better groomed today than yesterday.  A:  Support and encouragement was offered. 15 min checks continued for safety.  R: Pt remains safe.

## 2014-03-20 NOTE — [facility] Group Notes (Signed)
[facility] Group Notes:  Goals group and Recovery  Date:  03/20/2014  Time:  10:45 AM  Type of Therapy:  Nurse Education  Participation Level:  Active  Participation Quality:  Appropriate  Affect:  Appropriate  Cognitive:  Appropriate  Insight:  Appropriate  Engagement in Group:  Engaged  Modes of Intervention:  Discussion  Summary of Progress/Problems:pt would like to continue to take his medication and go home soon  Nicole Cella 03/20/2014, 10:45 AM

## 2014-03-20 NOTE — Tx Team (Signed)
Interdisciplinary Treatment Plan Update (Adult)   Date: 03/20/2014   Time Reviewed: 1:23 PM  Progress in Treatment:  Attending groups: Yes  Participating in groups: Minimally Taking medication as prescribed: Yes  Tolerating medication: Yes  Family/Significant othe contact made: SPE completed and collateral info received from pt's mother.  Patient understands diagnosis: Partially, AEB seeking treatment for medication stabilization. Pt demonstrates limited insight at this time, minimally engaged with staff who are attempting to ask him questions. Discussing patient identified problems/goals with staff: Yes  Medical problems stabilized or resolved: Yes  Denies suicidal/homicidal ideation: Yes during admission/self report  Patient has not harmed self or Others: Yes  New problem(s) identified:  Discharge Plan or Barriers: Pt will return home to his mother and go to [facility] for med management/assessment for therapy services. CSW sent referral to [facility] for ACT services. CSW assessing.   Additional comments:  N/a     Reason for Continuation of Hospitalization: Mood stabilization Med management  Estimated length of stay: 3-4 days  For review of initial/current patient goals, please see plan of care.  Attendees:  Patient:    Family:    Physician: Dr. Elna Breslow MD 03/20/2014 1:23 PM   Nursing: Marylu Lund RN 03/20/2014 1:23 PM   Clinical Social Worker Jasmina Gendron Smart, LCSWA  03/20/2014 1:23 PM   Other: Daryel Gerald, LCSW 03/20/2014 1:23 PM   Other: Jan RN 03/20/2014 1:23 PM   Other: Dorothyann Peng, LCSW  03/20/2014 1:23 PM   Other: Delores S. Care Coordinator 03/20/2014 1:23PM   Scribe for Treatment Team:  Herbert Seta Smart LCSWA 03/20/2014 1:23 PM

## 2014-03-20 NOTE — Progress Notes (Signed)
Patient ID: Mario [name], male   DOB: 03-31-1991, 23 y.o.   MRN: [id number]   D: Writer found it difficult to engage the pt in conversation. When asked questions pt would look at the writer. Pt would only say,"ok" when asked about his day. Writer attempted to get pt to talk more, and decided to ask pt to tell her about the conversation he had with his Dr. Rock Nephew stated, "the dr's having been talking to me".  A:  Support and encouragement was offered. 15 min checks continued for safety.  R: Pt remains safe.

## 2014-03-20 NOTE — Progress Notes (Signed)
D:Pt has body odor and is disheveled on the unit. Pt's clothing were washed this morning and he was encouraged to shower by multiple staff members. Pt ran the water and no soap was used. He continues to have body odor. Pt is guarded and suspicious of staff becoming irritable when asked to shower. Pt denies hallucinations. When taking his medications he stares puts them in his mouth takes them out and puts them in again. Questionable responding to internal stimuli. A:Offered support, encouragement and 15 minute checks. R:Pt denies si and hi. Safety maintained on the unit.

## 2014-03-21 MED ORDER — HALOPERIDOL 5 MG PO TABS
15.0000 mg | ORAL_TABLET | Freq: Every evening | ORAL | Status: DC
  Filled 2014-03-21: qty 3

## 2014-03-21 MED ORDER — HALOPERIDOL 5 MG PO TABS
20.0000 mg | ORAL_TABLET | Freq: Every evening | ORAL | Status: DC
  Administered 2014-03-22: 20 mg via ORAL
  Filled 2014-03-21 (×2): qty 4

## 2014-03-21 MED ORDER — DIPHENHYDRAMINE HCL 25 MG PO CAPS
25.0000 mg | ORAL_CAPSULE | Freq: Every evening | ORAL | Status: DC
  Administered 2014-03-22 – 2014-03-25 (×4): 25 mg via ORAL
  Filled 2014-03-21 (×5): qty 1

## 2014-03-21 MED ORDER — HALOPERIDOL 5 MG PO TABS
15.0000 mg | ORAL_TABLET | Freq: Once | ORAL | Status: AC
  Administered 2014-03-21: 15 mg via ORAL
  Filled 2014-03-21: qty 3

## 2014-03-21 NOTE — Plan of Care (Signed)
Problem: Diagnosis: Increased Risk For Suicide Attempt Goal: LTG-Patient Will Show Positive Response to Medication LTG (by discharge) : Patient will show positive response to medication and will participate in the development of the discharge plan.  Outcome: Progressing Patient is seen more in the milieu at times this morning. Patient takes his medication as prescribed. Patient allowed staff to wash clothing after patient urinated on them

## 2014-03-21 NOTE — [facility] Group Notes (Signed)
[facility] LCSW Aftercare Discharge Planning Group Note   03/21/2014 12:44 PM  Participation Quality:  Invited.  Chose to not attend    [name], Thereasa Distance B

## 2014-03-21 NOTE — Progress Notes (Signed)
Patient ID: Mario [name], male   DOB: 1990/12/02, 23 y.o.   MRN: [id number]  D: Pt. Denies SI/HI and A/V Hallucinations. Patient does not report any pain or discomfort at this time. Patient continues to refuse to allow writer to look at wound on right thigh. Patient will not allow staff to apply the ointment that has been prescribed. Patient continues to have body odor from lack of performing necessary hygiene. Patient reports that he wants to leave today.   A: Support and encouragement provided to the patient to keep following treatment team and perform ADL's but patient does not reply. Medications administered to patient per physician's orders. Patient is continuing to place medication in his mouth and remove pills then place them in his mouth.  R: Patient is minimal with Clinical research associate other than to express his frustration about staying here at [facility] longer. Patient is seen more in the milieu this morning but as the morning has gone on patient has returned to his room to lay in bed. Q15 minute checks are maintained for safety.

## 2014-03-21 NOTE — Progress Notes (Signed)
Did not attend. 

## 2014-03-21 NOTE — Clinical Social Work Note (Signed)
CSW spoke with Lafonda Mosses from [facility]. Resent referral for review. She will call today to let us know if pt can be picked up for ACT services. Lafonda Mosses stated that since pt does not yet have Medicaid, they may not be able to take him as a client.   [employer], LCSWA  03/21/2014 11:14 AM

## 2014-03-21 NOTE — Progress Notes (Signed)
Patient ID: Mario [name], male   DOB: 1991/06/15, 23 y.o.   MRN: [id number] [facility] MD Progress Note  03/21/2014 10:58 AM [name]  MRN:  [id number] Subjective: "I am feeling fine"   Objective: Pt seen and chart reviewed. Pt continues to be disheveled with some improvement. Patient is being encouraged to take a shower and change his clothes. Patient is fixed on being discharged and wants to go home today. Patient continues to have a flat affect and appears to slowed and withdrawn. Patients speech continues to be slow ,but he has been able to communicate more which is a big progress. He  denies SI, HI, and AVH, contracts for safety. Patient is compliant on medications.  Per staff ,patient refuses to take care of self and needs a lot of encouragement to shower and change clothes. He is compliant on medications.  Diagnosis:   DSM5: Primary psychiatric diagnosis:  Schizophrenia ,Multiple episodes ,currently in acute episodes   Secondary Psychiatric diagnosis;  Cannabis use disorder   Total Time spent with patient: 30 minutes  ADL's:  Impaired  Sleep: Good  Appetite:  Good  Suicidal Ideation:  Denies ,but was wandering streets and is a danger to self at this time.  Homicidal Ideation:  Denies   Psychiatric Specialty Exam: Physical Exam  Constitutional: He is oriented to person, place, and time. He appears well-developed and well-nourished.  HENT:  Head: Normocephalic and atraumatic.  Neck: Normal range of motion.  Neurological: He is alert and oriented to person, place, and time.    Review of Systems  Constitutional: Negative.   HENT: Negative.   Eyes: Negative.   Respiratory: Negative.   Cardiovascular: Negative.   Gastrointestinal: Negative.   Genitourinary: Negative.   Musculoskeletal: Negative.   Skin: Negative.        Has a lesion on his legs-not cooperative with assessing it per nursing staff -healing  Neurological: Negative.   Endo/Heme/Allergies:  Negative.   Psychiatric/Behavioral: Positive for substance abuse. Negative for suicidal ideas (but continues to be disorganized ,has been wandering streets before coming in to hospital). The patient is nervous/anxious.     Blood pressure 140/95, pulse 89, temperature 98.2 F (36.8 C), temperature source Oral, resp. rate 20, height  (1.676 m), weight 68.947 kg (152 lb), SpO2 99.00%.Body mass index is 24.55 kg/(m^2).  General Appearance: Disheveled,not taking showers,not changing clothes  Eye Contact::  Fair  Speech:  Slow improved  Volume:  Normal  Mood:  Depressed  Affect:  Restricted  Thought Process:  Coherent  Orientation:  Other:  to person and place  Thought Content:  WDL   Suicidal Thoughts:  No ,but was wandering the streets ,and laying in his own urine before coming in to hospital  Homicidal Thoughts:  No  Memory:  Immediate;   Fair Recent;   Poor Remote;   Poor  Judgement:  Impaired  Insight:  Lacking  Psychomotor Activity:  Decreased  Concentration:  Poor  Recall:  Poor  Fund of Knowledge:Poor  Language: Fair  Akathisia:  No    AIMS (if indicated):   0  Assets:  Social Support  Sleep:  Number of Hours: 6.75   Musculoskeletal: Strength & Muscle Tone: within normal limits Gait & Station: normal Patient leans: N/A  Current Medications: Current Facility-Administered Medications  Medication Dose Route Frequency Provider Last Rate Last Dose  . bacitracin ointment 1 application  1 application Topical BID Lise Auer Piepenbrink, PA-C   1 application at 03/18/14 0802  .  buPROPion (WELLBUTRIN XL) 24 hr tablet 150 mg  150 mg Oral Daily Shaniyah Wix, MD   150 mg at 03/21/14 0830  . [START ON 03/22/2014] diphenhydrAMINE (BENADRYL) capsule 25 mg  25 mg Oral QPM Caroline Matters, MD      . diphenhydrAMINE (BENADRYL) injection 50 mg  50 mg Intramuscular Once Eiliana Drone, MD      . haloperidol (HALDOL) tablet 15 mg  15 mg Oral QPM Lashondra Vaquerano, MD      . haloperidol  (HALDOL) tablet 5 mg  5 mg Oral Daily Kensli Bowley, MD   5 mg at 03/21/14 0830  . lamoTRIgine (LAMICTAL) tablet 25 mg  25 mg Oral BID Jomarie Longs, MD   25 mg at 03/21/14 0830  . LORazepam (ATIVAN) injection 2 mg  2 mg Intramuscular Once Spencer E Simon, PA-C      . magnesium hydroxide (MILK OF MAGNESIA) suspension 30 mL  30 mL Oral Daily PRN Shuvon Rankin, NP      . traZODone (DESYREL) tablet 50 mg  50 mg Oral QHS PRN Jomarie Longs, MD        Lab Results: No results found for this or any previous visit (from the past 48 hour(s)).  Physical Findings: AIMS: Facial and Oral Movements Muscles of Facial Expression: None, normal Lips and Perioral Area: None, normal Jaw: None, normal Tongue: None, normal,Extremity Movements Upper (arms, wrists, hands, fingers): None, normal Lower (legs, knees, ankles, toes): None, normal, Trunk Movements Neck, shoulders, hips: None, normal, Overall Severity Severity of abnormal movements (highest score from questions above): None, normal Incapacitation due to abnormal movements: None, normal Patient's awareness of abnormal movements (rate only patient's report): No Awareness, Dental Status Current problems with teeth and/or dentures?: No Does patient usually wear dentures?: No  CIWA:    COWS:     Treatment Plan Summary: Daily contact with patient to assess and evaluate symptoms and progress in treatment Medication management  Plan: Patient continues to be withdrawn ,disorganized,does not take care of ADLs .Patient has no insight in to his diagnosis.Talks in monosyllables. Will continue treatment and stabilization. Increase haldol to  20 mg po daily. Change  Benadryl to 25 mg po qhs for EPS.  Continue Lamictal 25 mg po bid for affective sx. Continue Wellbutrin XL 150 mg for motivation as well as energy. Continue Trazodone 50 mg po qhs prn for sleep.  Patient to be encouraged to take care of self and change his clothes. CSW will work on  disposition.  Patient to participate in milieu/attend groups.    Medical Decision Making Problem Points:  Established problem, stable/improving (1) and Review of psycho-social stressors (1) Data Points:  Review of medication regiment & side effects (2) Review of new medications or change in dosage (2)  I certify that inpatient services furnished can reasonably be expected to improve the patient's condition.   Artavis Cowie, MD 03/21/2014, 10:58 AM

## 2014-03-21 NOTE — [facility] Group Notes (Signed)
[facility] LCSW Group Therapy  03/21/2014 2:05 PM  Type of Therapy:  Group Therapy  Participation Level:  None  Participation Quality:  Attentive  Affect:  Flat  Cognitive:  Appropriate  Insight:  Limited  Engagement in Therapy:  Limited  Modes of Intervention:  Education, Socialization and Support  Summary of Progress/Problems:[employer] (MHA) speaker came to talk about his personal journey with substance abuse and mental illness. Group members were challenged to process ways by which to relate to the speaker. MHA speaker provided handouts and educational information pertaining to groups and services offered by the [facility]. Pt appeared to be engaged during the presentation but left before it was over.     [name],[name] 03/21/2014, 2:05 PM

## 2014-03-22 MED ORDER — LAMOTRIGINE 25 MG PO TABS
50.0000 mg | ORAL_TABLET | Freq: Two times a day (BID) | ORAL | Status: DC
  Administered 2014-03-22 – 2014-03-23 (×2): 50 mg via ORAL
  Filled 2014-03-22 (×4): qty 2

## 2014-03-22 NOTE — Progress Notes (Signed)
Patient ID: Mario [name], male   DOB: 25-Oct-1990, 23 y.o.   MRN: [id number] [facility] MD Progress Note  03/22/2014 11:26 AM Mario [name]  MRN:  [id number] Subjective: "I am feeling fine"   Objective: Pt seen and chart reviewed. Pt continues to be disheveled and has been urinating on self off and on. Patient is being encouraged to take shower ,but he has not been using soap and goes in to the bathroom ,puts water on himself and comes out. He has to be constantly encouraged to change his clothes. Patient does appear to be more interactive and has been compliant with medications.  He  denies SI, HI, and AVH, contracts for safety. Even though he denies any current symptoms ,patient appears to be disorganized ,has irrelevant thought process and would benefit from a continued stay. Patient is compliant on medications.  Per staff ,patient refuses to take care of self and needs a lot of encouragement to shower and change clothes. He is compliant on medications.Patient has a wound on his leg which was reported as from urinating on self ,he continues to refuse it to be assessed.   Diagnosis:   DSM5: Primary psychiatric diagnosis:  Schizophrenia ,Multiple episodes ,currently in acute episodes   Secondary Psychiatric diagnosis;  Cannabis use disorder   Non -psychiatric diagnosis: Wound on leg  Total Time spent with patient: 30 minutes  ADL's:  Impaired  Sleep: Good  Appetite:  Good  Suicidal Ideation:  Denies ,but was wandering streets and is a danger to self at this time.  Homicidal Ideation:  Denies   Psychiatric Specialty Exam: Physical Exam  Constitutional: He is oriented to person, place, and time. He appears well-developed and well-nourished.  HENT:  Head: Normocephalic and atraumatic.  Neck: Normal range of motion.  Neurological: He is alert and oriented to person, place, and time.    Review of Systems  Constitutional: Negative.   HENT: Negative.   Eyes: Negative.    Respiratory: Negative.   Cardiovascular: Negative.   Gastrointestinal: Negative.   Genitourinary: Negative.   Musculoskeletal: Negative.   Skin: Negative.        Has a lesion on his legs-not cooperative with assessing it   Neurological: Negative.   Endo/Heme/Allergies: Negative.   Psychiatric/Behavioral: Positive for substance abuse. Negative for suicidal ideas (but continues to be disorganized ,has been wandering streets before coming in to hospital). The patient is nervous/anxious.     Blood pressure 132/84, pulse 106, temperature 98 F (36.7 C), temperature source Oral, resp. rate 18, height  (1.676 m), weight 68.947 kg (152 lb), SpO2 99.00%.Body mass index is 24.55 kg/(m^2).  General Appearance: Disheveled,not taking showers,not changing clothes,needs a lot of encouragement to do so. Patient continues to be guarded.  Eye Contact::  Fair  Speech:  Slow improved  Volume:  Normal  Mood:  Depressed  Affect:  Restricted  Thought Process:  Coherent  Orientation:  Other:  to person and place  Thought Content:  WDL   Suicidal Thoughts:  No ,but was wandering the streets ,and laying in his own urine before coming in to hospital  Homicidal Thoughts:  No  Memory:  Immediate;   Fair Recent;   Poor Remote;   Poor  Judgement:  Impaired  Insight:  Lacking  Psychomotor Activity:  Decreased  Concentration:  Poor  Recall:  Poor  Fund of Knowledge:Poor  Language: Fair  Akathisia:  No    AIMS (if indicated):   0  Assets:  Social Support  Sleep:  Number of Hours: 6.75   Musculoskeletal: Strength & Muscle Tone: within normal limits Gait & Station: normal Patient leans: N/A  Current Medications: Current Facility-Administered Medications  Medication Dose Route Frequency Provider Last Rate Last Dose  . bacitracin ointment 1 application  1 application Topical BID Lise Auer Piepenbrink, PA-C   1 application at 03/18/14 0802  . buPROPion (WELLBUTRIN XL) 24 hr tablet 150 mg  150 mg  Oral Daily Jomarie Longs, MD   150 mg at 03/22/14 0752  . diphenhydrAMINE (BENADRYL) capsule 25 mg  25 mg Oral QPM Guage Efferson, MD      . diphenhydrAMINE (BENADRYL) injection 50 mg  50 mg Intramuscular Once Adreonna Yontz, MD      . haloperidol (HALDOL) tablet 20 mg  20 mg Oral QPM Konner Warrior, MD      . lamoTRIgine (LAMICTAL) tablet 50 mg  50 mg Oral BID Jomarie Longs, MD      . LORazepam (ATIVAN) injection 2 mg  2 mg Intramuscular Once Spencer E Simon, PA-C      . magnesium hydroxide (MILK OF MAGNESIA) suspension 30 mL  30 mL Oral Daily PRN Shuvon Rankin, NP      . traZODone (DESYREL) tablet 50 mg  50 mg Oral QHS PRN Jomarie Longs, MD        Lab Results: No results found for this or any previous visit (from the past 48 hour(s)).  Physical Findings: AIMS: Facial and Oral Movements Muscles of Facial Expression: None, normal Lips and Perioral Area: None, normal Jaw: None, normal Tongue: None, normal,Extremity Movements Upper (arms, wrists, hands, fingers): None, normal Lower (legs, knees, ankles, toes): None, normal, Trunk Movements Neck, shoulders, hips: None, normal, Overall Severity Severity of abnormal movements (highest score from questions above): None, normal Incapacitation due to abnormal movements: None, normal Patient's awareness of abnormal movements (rate only patient's report): No Awareness, Dental Status Current problems with teeth and/or dentures?: No Does patient usually wear dentures?: No  CIWA:    COWS:     Treatment Plan Summary: Daily contact with patient to assess and evaluate symptoms and progress in treatment Medication management  Plan: Patient continues to be withdrawn ,disorganized,does not take care of ADLs .Patient has no insight in to his diagnosis.Patient is more interactive.Continues to refuse to let his wound be examined. Nursing staff will ask mom to assist [place] in examining his wound when she comes to visit. Will continue treatment and  stabilization. Will continue haldol  20 mg po daily. Continue Benadryl  25 mg po qhs for EPS.  Increase Lamictal to  po bid for affective sx. Continue Wellbutrin XL 150 mg for motivation as well as energy. Continue Trazodone 50 mg po qhs prn for sleep.  Patient to be encouraged to take care of self and change his clothes. CSW will work on disposition.  Patient to participate in milieu/attend groups.     Medical Decision Making Problem Points:  Established problem, stable/improving (1) and Review of psycho-social stressors (1) Data Points:  Review of medication regiment & side effects (2) Review of new medications or change in dosage (2)  I certify that inpatient services furnished can reasonably be expected to improve the patient's condition.   Kylynn Street, MD 03/22/2014, 11:26 AM

## 2014-03-22 NOTE — Progress Notes (Signed)
Patient ID: Mario [name], male   DOB: 05/28/1991, 23 y.o.   MRN: [id number]  DAR: Pt. Denies SI/HI and A/V Hallucinations. Patient does not report any pain or discomfort at this time. Patient is continuing to avoid ADL's today. Patient is malodorous and disheveled. Patient continues to stay in bed and has not been attending group. Support and encouragement provided to the patient to do ADL's and attend groups. Patient is minimal and isolative. Patient is seen in the milieu at times but mostly stays in room. Q15 minute checks are maintained for safety.

## 2014-03-22 NOTE — Progress Notes (Signed)
Patient ID: Mario [name], male   DOB: December 09, 1990, 23 y.o.   MRN: [id number]  [facility] Group Notes:  (Nursing/MHT/Case Management/Adjunct)  Date:  03/22/2014  Time:  10:52 AM  Type of Therapy:  Nurse Education  Participation Level:  Did Not Attend  Participation Quality:  Did not attend  Affect:  Did not attend  Cognitive:  Did not attend  Insight:  None  Engagement in Group:  None  Modes of Intervention:  Discussion  Summary of Progress/Problems: Today's group focused on self care, leisure, and lifestyle changes. Patient was invited to group but chose not to attend.

## 2014-03-22 NOTE — Progress Notes (Signed)
Patient ID: Mario [name], male   DOB: 07-01-1991, 23 y.o.   MRN: [id number]  Jarek's mother came to unit tonight and writer tried to assess patient's wound/abrasion and do a dressing change. Patient did not allow writer to assess wound/abrasion area and would not allow mother to assess it either. Patient became verbally aggressive towards mother and father about self care and doing ADL's. Writer tried to encourage patient to Doctor, hospital the area and allow a dressing change but Clinical research associate refused again. Patient has been refusing to allow staff to address this area in question. Patient has also continued to urinate on self.

## 2014-03-22 NOTE — Clinical Social Work Note (Signed)
CSW left voicemail for pt's mother, requesting that she visit with pt today and assist RN with looking at his wound, as pt is refusing to allow [facility] staff to assist.   [facility], LCSWA 03/22/2014 10:53 AM

## 2014-03-22 NOTE — [facility] Group Notes (Signed)
[facility] LCSW Group Therapy  03/22/2014 3:15 PM  Type of Therapy:  Group Therapy  Participation Level:  Invited. Did not attend.   [name],[name] 03/22/2014, 3:15 PM

## 2014-03-22 NOTE — Plan of Care (Signed)
Problem: Diagnosis: Increased Risk For Suicide Attempt Goal: LTG-Patient Will Report Improved Mood and Deny Suicidal LTG (by discharge) Patient will report improved mood and deny suicidal ideation.  Outcome: Progressing Patient denies SI and reports he is feeling "fine." Objectively patient appears to have mood improvement from admission.

## 2014-03-22 NOTE — Progress Notes (Signed)
Adult Psychoeducational Group Note  Date:  03/22/2014 Time:  10:29 PM  Group Topic/Focus:  Wrap-Up Group:   The focus of this group is to help patients review their daily goal of treatment and discuss progress on daily workbooks.  Participation Level:  Did Not Attend  Participation Quality:  Drowsy  Affect:  Flat  Cognitive:  Confused  Insight: None  Engagement in Group:  None  Modes of Intervention:  None  Additional Comments:  Pt was not a group  Natonya Finstad R 03/22/2014, 10:29 PM

## 2014-03-22 NOTE — Tx Team (Signed)
Interdisciplinary Treatment Plan Update (Adult)   Date: 03/22/2014   Time Reviewed: 10:53 AM  Progress in Treatment:  Attending groups: Yes  Participating in groups: Minimally Taking medication as prescribed: Yes  Tolerating medication: Yes  Family/Significant othe contact made: SPE completed and collateral info received from pt's mother.  Patient understands diagnosis: Partially, AEB seeking treatment for medication stabilization. Pt demonstrates limited insight at this time, minimally engaged with staff who are attempting to ask him questions. Discussing patient identified problems/goals with staff: Yes  Medical problems stabilized or resolved: Yes  Denies suicidal/homicidal ideation: Yes during admission/self report  Patient has not harmed self or Others: Yes  New problem(s) identified: Pt refusing to allow staff to look at his leg wound. CSW left message today with mother requesting that she visit him today and work with pt to allow [facility] staff to assess his wound.  Discharge Plan or Barriers: Pt will return home to his mother and go to [facility] for med management/assessment for therapy services. Referral made to [facility] for ACT and [facility] for ACT. Both have no IPRS funding to assist pt with this service.   Additional comments:      Reason for Continuation of Hospitalization: Mood stabilization Med management  Estimated length of stay: 3-4 days (tentative d/c Monday)  For review of initial/current patient goals, please see plan of care.  Attendees:  Patient:    Family:    Physician: Dr. Elna Breslow MD 03/22/2014 10:53 AM   Nursing: Foy Guadalajara RN 03/22/2014 10:53 AM   Clinical Social Worker Prabhleen Montemayor Smart, LCSWA  03/22/2014 10:53 AM   Other: Daryel Gerald, LCSW 03/22/2014 10:53 AM   Other: Sue Lush RN 03/22/2014 10:53 AM   Other: Dorothyann Peng, LCSW  03/22/2014 10:53 AM   Other: Charleston Ropes, CSW Intern   03/22/2014 10:55 AM   Scribe for Treatment Team:  [employer] LCSWA 03/22/2014 10:53 AM

## 2014-03-23 DIAGNOSIS — Z5189 Encounter for other specified aftercare: Secondary | ICD-10-CM

## 2014-03-23 MED ORDER — DIPHENHYDRAMINE HCL 50 MG/ML IJ SOLN
INTRAMUSCULAR | Status: AC
  Administered 2014-03-23: 50 mg via INTRAMUSCULAR
  Filled 2014-03-23: qty 1

## 2014-03-23 MED ORDER — HALOPERIDOL 5 MG PO TABS
25.0000 mg | ORAL_TABLET | Freq: Every evening | ORAL | Status: DC
  Administered 2014-03-23 – 2014-03-25 (×3): 25 mg via ORAL
  Filled 2014-03-23 (×4): qty 5

## 2014-03-23 MED ORDER — OLANZAPINE 10 MG PO TBDP
ORAL_TABLET | ORAL | Status: AC
  Filled 2014-03-23: qty 1

## 2014-03-23 MED ORDER — DIPHENHYDRAMINE HCL 50 MG/ML IJ SOLN
50.0000 mg | Freq: Once | INTRAMUSCULAR | Status: AC
  Administered 2014-03-23: 50 mg via INTRAMUSCULAR
  Filled 2014-03-23: qty 1

## 2014-03-23 MED ORDER — OLANZAPINE 10 MG IM SOLR: 10.0000 mg | Freq: Two times a day (BID) | INTRAMUSCULAR | Status: DC | PRN

## 2014-03-23 MED ORDER — LORAZEPAM 2 MG/ML IJ SOLN
2.0000 mg | Freq: Once | INTRAMUSCULAR | Status: AC
  Administered 2014-03-23: 2 mg via INTRAMUSCULAR

## 2014-03-23 MED ORDER — OLANZAPINE 10 MG PO TBDP
10.0000 mg | ORAL_TABLET | Freq: Two times a day (BID) | ORAL | Status: DC | PRN
  Administered 2014-03-23 – 2014-03-24 (×2): 10 mg via ORAL
  Filled 2014-03-23: qty 1

## 2014-03-23 MED ORDER — ARIPIPRAZOLE 5 MG PO TABS
5.0000 mg | ORAL_TABLET | Freq: Every day | ORAL | Status: DC
  Administered 2014-03-23 – 2014-03-26 (×4): 5 mg via ORAL
  Filled 2014-03-23 (×6): qty 1

## 2014-03-23 MED ORDER — LAMOTRIGINE 100 MG PO TABS
100.0000 mg | ORAL_TABLET | Freq: Two times a day (BID) | ORAL | Status: DC
  Administered 2014-03-23 – 2014-03-30 (×14): 100 mg via ORAL
  Filled 2014-03-23 (×8): qty 1
  Filled 2014-03-23: qty 28
  Filled 2014-03-23 (×3): qty 1
  Filled 2014-03-23: qty 28
  Filled 2014-03-23 (×6): qty 1

## 2014-03-23 MED ORDER — HALOPERIDOL LACTATE 5 MG/ML IJ SOLN
5.0000 mg | Freq: Once | INTRAMUSCULAR | Status: AC
  Administered 2014-03-23: 5 mg via INTRAMUSCULAR
  Filled 2014-03-23: qty 1

## 2014-03-23 MED ORDER — OLANZAPINE 5 MG PO TBDP
ORAL_TABLET | ORAL | Status: AC
  Filled 2014-03-23: qty 1

## 2014-03-23 MED ORDER — LORAZEPAM 2 MG/ML IJ SOLN
INTRAMUSCULAR | Status: AC
  Filled 2014-03-23: qty 1

## 2014-03-23 MED ORDER — HALOPERIDOL LACTATE 5 MG/ML IJ SOLN
INTRAMUSCULAR | Status: AC
  Administered 2014-03-23: 5 mg via INTRAMUSCULAR
  Filled 2014-03-23: qty 1

## 2014-03-23 MED ORDER — OLANZAPINE 5 MG PO TBDP
5.0000 mg | ORAL_TABLET | Freq: Once | ORAL | Status: DC
  Filled 2014-03-23: qty 1

## 2014-03-23 NOTE — Clinical Social Work Note (Signed)
CSW called [facility] [phone]; spoke with Jake Shark (clinician) to refer for Care Coordination Services. Clinician will contact Monique and his mother in 7 business days or less after review.   [employer], LCSWA 03/23/2014 11:02 AM

## 2014-03-23 NOTE — Progress Notes (Signed)
Patient ID: DAE HIGHLEY, male   DOB: 10-13-1990, 23 y.o.   MRN: [id number] D. Patient encouraged to shower before wound care nurse in to see patient. He continues to be wearing same urine stained and malodorous clothes. He refuses to shower and becomes agitated upon approach. He states '' I'm going home the doctor is sending me home and I'll take a shower there. '' Wound care nurse in to see patient with writer and patient continued to refuse any treatment. Pt was informed he was not slated for discharge and then attempted to punch wall in hallway. He was able to be redirected verbally. Notified Dr. Elna Breslow of above information. Will continue to monitor q 15 minutes for safety.

## 2014-03-23 NOTE — [facility] Group Notes (Signed)
[facility] LCSW Group Therapy  03/23/2014 2:47 PM  Type of Therapy:  Group Therapy  Participation Level:  Did Not Attend-pt in room sleeping.   Smart, Thompson Mckim LCSWA  03/23/2014, 2:47 PM

## 2014-03-23 NOTE — Progress Notes (Signed)
D: Pt denies SI/HI/AVH. Spoke to pt mother after trying to get the pt to show the wound to staff to get it assessed and change the dressing. Pt continued to refuse.   A: Pt was offered support and encouragement. Pt was given scheduled medications. Pt was encourage to attend groups. Q 15 minute checks were done for safety.   R: Pt is taking medication. Pt has no complaints.  safety maintained on unit.

## 2014-03-23 NOTE — Progress Notes (Signed)
Pt agitated with Clinical research associate. Pt would not respond to Clinical research associate. Another nurse on duty had to give him his medication. Per pt mother pt took shower tonight.    A:  Pt was given scheduled medications. Pt was encourage to attend groups. Q 15 minute checks were done for safety.  R:Pt is taking medication. Pt has no complaints. safety maintained on unit.

## 2014-03-23 NOTE — Progress Notes (Signed)
Pt was not allowed to go to breakfast yesterday due to him wetting himself and not showering, and not allowing staff to assess his wound and change his dressing. Pt became confrontational , agitated and irritated. Pt was finally coaxed back on [address] hall , with de-escalation techniques used by staff members (MHT's and nurses).

## 2014-03-23 NOTE — Progress Notes (Signed)
Patient ID: Mario [name], male   DOB: 13-Jul-1990, 23 y.o.   MRN: [id number] [facility] MD Progress Note  03/23/2014 11:33 AM Mario [name]  MRN:  [id number] Subjective: "I am feeling OK"  Objective: Pt seen and chart reviewed. Pt continues to be disheveled and not taking care of ADLs. Patient continues to have regressive behavior ,urinating on self . Patient continues to lack insight in to his problems. Patient is compliant on medications.Denies SI/HI/AH/VH. Patient however continues to be guarded and suspicious ,eventhough his speech has improved and is more spontaneous now. Patient allowed writer to examine his wounds this AM - he has multiple superficial wounds on his right upper anterior thigh,no discharge noted. Patient denies pain.Will call wound consult.  Per staff ,patient refuses to take care of self and needs a lot of encouragement to shower and change clothes. He is compliant on medications.Patent was aggressive this AM ,when he was asked not to go to breakfast without changing his clothes on which he had urinated.He required prns.  Diagnosis:   DSM5: Primary psychiatric diagnosis:  Schizophrenia ,Multiple episodes ,currently in acute episodes   Secondary Psychiatric diagnosis;  Cannabis use disorder   Non -psychiatric diagnosis: Wound on leg  Total Time spent with patient: 30 minutes  ADL's:  Impaired  Sleep: Good  Appetite:  Good  Suicidal Ideation:  Denies ,but was wandering streets and is a danger to self at this time.  Homicidal Ideation:  Denies   Psychiatric Specialty Exam: Physical Exam  Constitutional: He is oriented to person, place, and time. He appears well-developed and well-nourished.  HENT:  Head: Normocephalic and atraumatic.  Neck: Normal range of motion.  Neurological: He is alert and oriented to person, place, and time.    Review of Systems  Constitutional: Negative.   HENT: Negative.   Eyes: Negative.   Respiratory: Negative.    Cardiovascular: Negative.   Gastrointestinal: Negative.   Genitourinary: Negative.   Musculoskeletal: Negative.   Skin: Negative.        Has a lesion on his upper right anterior thigh-lesions is superficial ,pinkish ,no discharge noted  Neurological: Negative.   Endo/Heme/Allergies: Negative.   Psychiatric/Behavioral: Positive for substance abuse. Negative for suicidal ideas (but continues to be disorganized ,has been wandering streets before coming in to hospital). The patient is nervous/anxious.     Blood pressure 132/84, pulse 106, temperature 98 F (36.7 C), temperature source Oral, resp. rate 18, height  (1.676 m), weight 68.947 kg (152 lb), SpO2 99.00%.Body mass index is 24.55 kg/(m^2).  General Appearance: Disheveled,not taking showers,not changing clothes,needs a lot of encouragement to do so. Patient continues to be guarded.  Eye Contact::  Fair  Speech:  Slow improved  Volume:  Normal  Mood:  Depressed  Affect:  Restricted  Thought Process:  Coherent  Orientation:  Other:  to person and place  Thought Content:  Delusions and Paranoid Ideation   Suicidal Thoughts:  No ,but was wandering the streets ,and laying in his own urine before coming in to hospital  Homicidal Thoughts:  No  Memory:  Immediate;   Fair Recent;   Poor Remote;   Poor  Judgement:  Impaired  Insight:  Lacking  Psychomotor Activity:  Decreased  Concentration:  Poor  Recall:  Poor  Fund of Knowledge:Poor  Language: Fair  Akathisia:  No    AIMS (if indicated):   0  Assets:  Social Support  Sleep:  Number of Hours: 6.5   Musculoskeletal: Strength & Muscle  Tone: within normal limits Gait & Station: normal Patient leans: N/A  Current Medications: Current Facility-Administered Medications  Medication Dose Route Frequency Provider Last Rate Last Dose  . bacitracin ointment 1 application  1 application Topical BID Lise Auer Piepenbrink, PA-C   1 application at 03/18/14 0802  . buPROPion  (WELLBUTRIN XL) 24 hr tablet 150 mg  150 mg Oral Daily Jomarie Longs, MD   150 mg at 03/23/14 0814  . diphenhydrAMINE (BENADRYL) capsule 25 mg  25 mg Oral QPM Vernon Maish, MD   25 mg at 03/22/14 1728  . diphenhydrAMINE (BENADRYL) injection 50 mg  50 mg Intramuscular Once Dyanna Seiter, MD      . haloperidol (HALDOL) tablet 25 mg  25 mg Oral QPM Saamir Armstrong, MD      . lamoTRIgine (LAMICTAL) tablet 100 mg  100 mg Oral BID Jomarie Longs, MD      . LORazepam (ATIVAN) injection 2 mg  2 mg Intramuscular Once Spencer E Simon, PA-C      . magnesium hydroxide (MILK OF MAGNESIA) suspension 30 mL  30 mL Oral Daily PRN Shuvon Rankin, NP      . OLANZapine (ZYPREXA) injection 10 mg  10 mg Intramuscular Q12H PRN Leonides Minder, MD      . OLANZapine zydis (ZYPREXA) disintegrating tablet 10 mg  10 mg Oral Q12H PRN Jomarie Longs, MD   10 mg at 03/23/14 0815  . traZODone (DESYREL) tablet 50 mg  50 mg Oral QHS PRN Jomarie Longs, MD        Lab Results: No results found for this or any previous visit (from the past 48 hour(s)).  Physical Findings: AIMS: Facial and Oral Movements Muscles of Facial Expression: None, normal Lips and Perioral Area: None, normal Jaw: None, normal Tongue: None, normal,Extremity Movements Upper (arms, wrists, hands, fingers): None, normal Lower (legs, knees, ankles, toes): None, normal, Trunk Movements Neck, shoulders, hips: None, normal, Overall Severity Severity of abnormal movements (highest score from questions above): None, normal Incapacitation due to abnormal movements: None, normal Patient's awareness of abnormal movements (rate only patient's report): No Awareness, Dental Status Current problems with teeth and/or dentures?: No Does patient usually wear dentures?: No  CIWA:    COWS:     Treatment Plan Summary: Daily contact with patient to assess and evaluate symptoms and progress in treatment Medication management  Plan: Patient continues to be withdrawn  ,disorganized,does not take care of ADLs .Patient has no insight in to his diagnosis.Patient is more interactive. Will continue treatment and stabilization. Wound on his thigh examined -consult placed for wound care. Spoke to patient's mom today - she will assist staff with helping patient to shower and change . Will increase haldol to  25 mg po daily. Continue Benadryl  25 mg po qhs for EPS.  Will add Abilify 5 mg po daily since patient continues to be disorganized . Increase Lamictal to  po bid for affective sx. Continue Wellbutrin XL 150 mg for motivation as well as energy. Continue Trazodone 50 mg po qhs prn for sleep.  Patient to be encouraged to take care of self and change his clothes. Will place a wound consult for wound on his thighs. CSW will work on disposition.  Patient to participate in milieu/attend groups.     Medical Decision Making Problem Points:  Established problem, stable/improving (1) and Review of psycho-social stressors (1) Data Points:  Order Aims Assessment (2) Review of medication regiment & side effects (2) Review of new medications or change  in dosage (2)  I certify that inpatient services furnished can reasonably be expected to improve the patient's condition.   Islay Polanco, MD 03/23/2014, 11:33 AM

## 2014-03-23 NOTE — Progress Notes (Signed)
Patient ID: Mario [name], male   DOB: [birth year], 23 y.o.   MRN: [id number] D. Patient presents with flat affect , disorganized thoughts. His hygiene is very poor, as he is noted to be malodorous, wearing urine stained and very soiled clothing. Mario [name] is very guarded upon approach, and appears to be very disorganized at this time. He states to Clinical research associate upon encouraging shower '' I already took a shower. '' When writer noted that clothes were dirty patient became agitated, pacing, and appeared to be responding to internal stimuli.  He continues to require frequent redirection for activities on the unit, and ADL's. This morning he did allow MD to assess leg wound, and writer in room during that time. He refused any intervention when offered neosporin. He was encouraged to bathe and keep area clean.A . Discussed above information with Dr. Elna Breslow due to patients continued acute psychosis, and lack of progress. Also discussed above information with S. Ladona Ridgel RN AD, and Johnella Moloney RN AC. Orders received for prn medication for agitation, which patient accepted (see eMAR). Orders received for no room mate order due to patients lack of hygiene and foul odor in the room. Also, patients mother phoned and Clinical research associate provided update. R. Patient continues to be isolative, in his room lying in bed. He denies any concerns at this time stating ''I'm fine. '' Will continue to monitor q 15 minutes for safety.

## 2014-03-23 NOTE — Consult Note (Addendum)
WOC wound consult note Reason for Consult: Consult requested for thigh/leg wound.  Pt is reported to have multiple superficial wounds to right upper leg according to EMR.  He is frequently incontinent of urine and remains in wet jeans which are rubbing against the wounds.  According to progress notes he has refused to change clothes, take a shower, or allow wound to be assessed.   Wound type: Bedside nurse was able to view wound earlier today.  She describes wound as "red and raw, like a burn"  She felt it was related to incontinence.  Attempted to discuss wound etiology and assess site but patient is agitated and refused. Suggested topical treatment options to patient and he states "I don't want to put anything on it."  Dressing procedure/placement/frequency: Foam dressing and incontinence barrier cream left with bedside nurse in case patient has a change of heart and will allow assessment and topical treatment to be applied to affected area.   Please re-consult if further assistance is needed.  Thank-you,  Cammie Mcgee MSN, RN, CWOCN, [place], CNS [phone]

## 2014-03-23 NOTE — Progress Notes (Signed)
Patient ID: Mario [name], male   DOB: September 23, 1990, 23 y.o.   MRN: [id number] D. Pt increasingly agitated, refusing prn medication, kicking chair in hallway and pacing. Patient has been unable to be verbally de-escalated, offered prn injectable medications and he refused. Notified Dr. Elna Breslow and orders received. Notified Dr. Dub Mikes for second opinion for forced medication order. Orders received. Patient continued to refuse medications stating ''if you touch me I'm gonna touch you! '' threatening staff. Patient placed in manual hold at 1545 to receive injectable medications. Pt received haldol  IM, Benadryl  IM, Ativan  IM. Patient continued to kick and swing at staff and required physical manual hold to assist in de-escalation. Patient attempted to kick at staff, and would not sit in chair upon redirection. Notified Dr. Dub Mikes of above information and orders received for additional haldol  IM. Patient received at 1600. Patient then assisted to his room where he was able to contract for safety. He was provided gatorade and sat on bed in no acute distress. Manual hold released at 1605. Writer phoned pt mother Daniele Yankowski emergency contact and left voicemail at [phone]. Pt currently monitored 1.1 per protocol. In no acute distress at this time.

## 2014-03-23 NOTE — Progress Notes (Signed)
[facility] Group Notes:  (Nursing/MHT/Case Management/Adjunct)  Date:  03/23/2014  Time:  9:29 PM  Type of Therapy:  Group Therapy  Participation Level:  None  Participation Quality:  Resistant  Affect:  Defensive, Irritable and Resistant  Cognitive:  Alert  Insight:  None  Engagement in Group:  None  Modes of Intervention:  Socialization and Support  Summary of Progress/Problems: Pt. Stated he did not was to share in group discussion.  Sondra Come 03/23/2014, 9:29 PM

## 2014-03-23 NOTE — [facility] Group Notes (Signed)
[facility] LCSW Aftercare Discharge Planning Group Note   03/23/2014 10:24 AM  Participation Quality:  Invited.  Chose to not attend    [name], Mario [name]

## 2014-03-23 NOTE — Progress Notes (Signed)
Patient ID: Mario [name], male   DOB: 28-Jun-1991, 23 y.o.   MRN: [id number] D. Patient is sitting in dayroom calmly eating dinner. A.No further voiced concerns at this time. Will continue to monitor q 15 minutes for safety.

## 2014-03-24 DIAGNOSIS — F2081 Schizophreniform disorder: Secondary | ICD-10-CM

## 2014-03-24 MED ORDER — LORAZEPAM 2 MG/ML IJ SOLN: 2.0000 mg | Freq: Three times a day (TID) | INTRAMUSCULAR | Status: DC | PRN

## 2014-03-24 MED ORDER — DIPHENHYDRAMINE HCL 50 MG/ML IJ SOLN: 50.0000 mg | Freq: Three times a day (TID) | INTRAMUSCULAR | Status: DC | PRN

## 2014-03-24 MED ORDER — HALOPERIDOL LACTATE 5 MG/ML IJ SOLN: 10.0000 mg | Freq: Three times a day (TID) | INTRAMUSCULAR | Status: DC | PRN

## 2014-03-24 NOTE — Plan of Care (Signed)
Problem: Diagnosis: Increased Risk For Suicide Attempt Goal: LTG-Patient Will Show Positive Response to Medication LTG (by discharge) : Patient will show positive response to medication and will participate in the development of the discharge plan.  Outcome: Not Progressing Pt continues to refuse help, stating "I'm ok". Pt continues to isolates and pace halls at times.  Goal: LTG-Patient Will Report Absence of Withdrawal Symptoms LTG (by discharge): Patient will report absence of withdrawal symptoms.  Outcome: Completed/Met Date Met:  03/24/14 Pt does not appear to have withdrawal Sx  Goal: STG-Patient Will Comply With Medication Regime Outcome: Progressing Pt is taking medication     

## 2014-03-24 NOTE — Progress Notes (Signed)
Patient ID: Mario [name], male   DOB: 1991-01-25, 23 y.o.   MRN: [id number] D. Patient presents with flat affect , disorganized thoughts again today. His hygiene remains very poor, as he is noted to be malodorous, despite being provided and encouraged to shower. Mario [name] remains very guarded and irritable, initially refusing to answer staff upon approach. He also initially refused medications, but did later accept from staff. A . Discussed above information with A. Nwoko NP , and patients continued psychosis, disorganized thoughts, and refusal to allow staff to treat leg wound.  Also discussed above information with E. Arlyce Dice RN AC.  R. Patient continues to be isolative, in his room lying in No further voiced concerns at this time. Will continue to monitor q 15 minutes for safety.

## 2014-03-24 NOTE — [facility] Group Notes (Signed)
[facility] Group Notes:  (Nursing/MHT/Case Management/Adjunct)  Date:  03/24/2014  Time:  11:56 AM  Type of Therapy:  Psychoeducational Skills- Healthy Coping Skills  Participation Level:  Did Not Attend  Mario [name] 03/24/2014, 11:56 AM

## 2014-03-24 NOTE — [facility] Group Notes (Signed)
[facility] Group Notes:  (Nursing/MHT/Case Management/Adjunct)  Date:  03/24/2014  Time:  11:55 AM  Type of Therapy:  Psychoeducational Skills- Patient Self Inventory  Participation Level:  Did Not Attend  Buford Dresser 03/24/2014, 11:55 AM

## 2014-03-24 NOTE — Progress Notes (Signed)
Patient ID: Mario Mario [name], male   DOB: 08-Oct-1990, 23 y.o.   MRN: [id number] Patient ID: Mario Mario [name], male   DOB: [birth year], 23 y.o.   MRN: [id number] [facility] MD Progress Note  03/24/2014 2:52 PM Mario Mario [name]  MRN:  [id number]  Subjective: Mario Mario [name] reports, "Mario [name] doing fine"  Objective: Pt seen and chart reviewed. Pt was seen in his room whiel lying in his bed. He remains disheveled and not taking care of ADLs. He was encouraged to get in the shower last night, but did not use any soap. He self isolates in his room.  Patient continues to lack insight to his problems. He  is compliant with his medications.Denies SI/HI/AH/VH. Patient however continues to be guarded and suspicious ,eventhough his speech has improved and is more spontaneous. Patient would not allowed writer to examine his wounds this afternoon.  he has multiple superficial wounds to his right thigh., wound care consulted nurse, Dayln refused  for his wound to be assessed and or evaluated.   Diagnosis:   DSM5: Primary psychiatric diagnosis:  Schizophrenia ,Multiple episodes ,currently in acute episodes   Secondary Psychiatric diagnosis;  Cannabis use disorder   Non -psychiatric diagnosis: Wound on leg  Total Time spent with patient: 30 minutes  ADL's:  Impaired  Sleep: Good  Appetite:  Good  Suicidal Ideation:  Denies ,but was wandering streets and is a danger to self at this time.  Homicidal Ideation:  Denies  Psychiatric Specialty Exam: Physical Exam  Constitutional: He is oriented to person, place, and time. He appears well-developed and well-nourished.  HENT:  Head: Normocephalic and atraumatic.  Neck: Normal range of motion.  Neurological: He is alert and oriented to person, place, and time.    Review of Systems  Constitutional: Negative.   HENT: Negative.   Eyes: Negative.   Respiratory: Negative.   Cardiovascular: Negative.   Gastrointestinal: Negative.   Genitourinary: Negative.    Musculoskeletal: Negative.   Skin: Negative.        Has a lesion on his upper right anterior thigh-lesions is superficial ,pinkish ,no discharge noted  Neurological: Negative.   Endo/Heme/Allergies: Negative.   Psychiatric/Behavioral: Positive for substance abuse. Negative for suicidal ideas (but continues to be disorganized ,has been wandering streets before coming in to hospital). The patient is nervous/anxious.     Blood pressure 100/51, pulse 79, temperature 98 F (36.7 C), temperature source Oral, resp. rate 16, height  (1.676 m), weight 68.947 kg (152 lb), SpO2 99.00%.Body mass index is 24.55 kg/(m^2).  General Appearance: Disheveled,not taking showers,not changing clothes,needs a lot of encouragement to do so. Patient continues to be guarded.  Eye Contact::  Fair  Speech:  Slow improved  Volume:  Normal  Mood:  Depressed  Affect:  Restricted  Thought Process:  Coherent  Orientation:  Other:  to person and place  Thought Content:  Delusions and Paranoid Ideation   Suicidal Thoughts:  No ,but was wandering the streets ,and laying in his own urine before coming in to hospital  Homicidal Thoughts:  No  Memory:  Immediate;   Fair Recent;   Poor Remote;   Poor  Judgement:  Impaired  Insight:  Lacking  Psychomotor Activity:  Decreased  Concentration:  Poor  Recall:  Poor  Fund of Knowledge:Poor  Language: Fair  Akathisia:  No    AIMS (if indicated):   0  Assets:  Social Support  Sleep:  Number of Hours: 6.5   Musculoskeletal: Strength & Muscle Tone: within  normal limits Gait & Station: normal Patient leans: N/A  Current Medications: Current Facility-Administered Medications  Medication Dose Route Frequency Provider Last Rate Last Dose  . ARIPiprazole (ABILIFY) tablet 5 mg  5 mg Oral Daily Jomarie Longs, MD   5 mg at 03/24/14 0947  . bacitracin ointment 1 application  1 application Topical BID Lise Auer Piepenbrink, PA-C   1 application at 03/18/14 0802  .  buPROPion (WELLBUTRIN XL) 24 hr tablet 150 mg  150 mg Oral Daily Jomarie Longs, MD   150 mg at 03/24/14 0947  . diphenhydrAMINE (BENADRYL) capsule 25 mg  25 mg Oral QPM Jomarie Longs, MD   25 mg at 03/23/14 2123  . diphenhydrAMINE (BENADRYL) injection 50 mg  50 mg Intramuscular Once Saramma Eappen, MD      . diphenhydrAMINE (BENADRYL) injection 50 mg  50 mg Intramuscular Q8H PRN Sanjuana Kava, NP      . haloperidol (HALDOL) tablet 25 mg  25 mg Oral QPM Saramma Eappen, MD   25 mg at 03/23/14 2123  . haloperidol lactate (HALDOL) injection 10 mg  10 mg Intramuscular Q8H PRN Sanjuana Kava, NP      . lamoTRIgine (LAMICTAL) tablet 100 mg  100 mg Oral BID Jomarie Longs, MD   100 mg at 03/24/14 0947  . LORazepam (ATIVAN) injection 2 mg  2 mg Intramuscular Once [employer], PA-C      . LORazepam (ATIVAN) injection 2 mg  2 mg Intramuscular Q8H PRN Sanjuana Kava, NP      . magnesium hydroxide (MILK OF MAGNESIA) suspension 30 mL  30 mL Oral Daily PRN Shuvon Rankin, NP      . OLANZapine zydis (ZYPREXA) disintegrating tablet 5 mg  5 mg Oral Once Jomarie Longs, MD      . traZODone (DESYREL) tablet 50 mg  50 mg Oral QHS PRN Jomarie Longs, MD        Lab Results: No results found for this or any previous visit (from the past 48 hour(s)).  Physical Findings: AIMS: Facial and Oral Movements Muscles of Facial Expression: None, normal Lips and Perioral Area: None, normal Jaw: None, normal Tongue: None, normal,Extremity Movements Upper (arms, wrists, hands, fingers): None, normal Lower (legs, knees, ankles, toes): None, normal, Trunk Movements Neck, shoulders, hips: None, normal, Overall Severity Severity of abnormal movements (highest score from questions above): None, normal Incapacitation due to abnormal movements: None, normal Patient's awareness of abnormal movements (rate only patient's report): No Awareness, Dental Status Current problems with teeth and/or dentures?: No Does patient usually  wear dentures?: No  CIWA:    COWS:     Treatment Plan Summary: Daily contact with patient to assess and evaluate symptoms and progress in treatment Medication management  Plan: Continue crisis management/mood stabilization treatment .  Continue Haldol to  25 mg po daily for mood control, Benadryl  25 mg po qhs for EPS.  Continue Abilify 5 mg po daily for mood control, Lamictal to 100 mg po bid for mood stabilization,  Wellbutrin XL 150 mg for motivation as well as energy, Trazodone 50 mg po qhs prn for sleep.   Wound care consulted for wound on thigh areas, Bohdi refused to be seen . CSW will work on disposition.  Patient to participate in milieu/attend groups. Medical Decision Making  Problem Points:  Established problem, stable/improving (1) and Review of psycho-social stressors (1) Data Points:  Order Aims Assessment (2) Review of medication regiment & side effects (2) Review of new medications  or change in dosage (2)  Mario [name] certify that inpatient services furnished can reasonably be expected to improve the patient's condition.   Mario Mario [name], PMHNP. 03/24/2014, 2:52 PM  Mario [name] agreed with the findings, treatment and disposition plan of this patient. Kathryne Sharper, MD

## 2014-03-24 NOTE — [facility] Group Notes (Signed)
[facility] Group Notes: (Clinical Social Work)   03/24/2014      Type of Therapy:  Group Therapy   Participation Level:  Did Not Attend - was asked by MHT to attend, but was sleeping   Ambrose Mantle, LCSW 03/24/2014, 1:20 PM

## 2014-03-24 NOTE — Progress Notes (Signed)
Psychoeducational Group Note  Date:  03/24/2014 Time:  2055  Group Topic/Focus:  Wrap-Up Group:   The focus of this group is to help patients review their daily goal of treatment and discuss progress on daily workbooks.  Participation Level: Did Not Attend  Participation Quality:  Not Applicable  Affect:  Not Applicable  Cognitive:  Not Applicable  Insight:  Not Applicable  Engagement in Group: Not Applicable  Additional Comments:  The patient did not attend group since he remained in his bedroom.   Hazle Coca S 03/24/2014, 8:55 PM

## 2014-03-25 MED ORDER — CEPHALEXIN 500 MG PO CAPS
500.0000 mg | ORAL_CAPSULE | Freq: Two times a day (BID) | ORAL | Status: DC
  Administered 2014-03-25 – 2014-03-30 (×11): 500 mg via ORAL
  Filled 2014-03-25 (×4): qty 1
  Filled 2014-03-25: qty 2
  Filled 2014-03-25 (×2): qty 1
  Filled 2014-03-25: qty 3
  Filled 2014-03-25 (×7): qty 1

## 2014-03-25 NOTE — [facility] Group Notes (Signed)
[facility] Group Notes: (Clinical Social Work)   03/25/2014      Type of Therapy:  Group Therapy   Participation Level:  Did Not Attend - refused MHT request   Ambrose Mantle, LCSW 03/25/2014, 1:05 PM

## 2014-03-25 NOTE — Progress Notes (Signed)
Psychoeducational Group Note  Date:  03/25/2014 Time:  2221  Group Topic/Focus:  Wrap-Up Group:   The focus of this group is to help patients review their daily goal of treatment and discuss progress on daily workbooks.  Participation Level: Did Not Attend  Participation Quality:  Not Applicable  Affect:  Not Applicable  Cognitive:  Not Applicable  Insight:  Not Applicable  Engagement in Group: Not Applicable  Additional Comments:  The patient did not attend group this evening since he was asleep in his room.   Hazle Coca S 03/25/2014, 10:21 PM

## 2014-03-25 NOTE — Progress Notes (Signed)
Patient ID: Mario [name], male   DOB: 1990-08-31, 23 y.o.   MRN: [id number] D)  D)  Appears to be sleeping tonight, eyes closed, resp reg, unlabored, no c/o''s voiced. A)  Will continue to monitor for safety, continue POC R)  Safety maintained.

## 2014-03-25 NOTE — [facility] Group Notes (Signed)
[facility] Group Notes:  (Nursing/MHT/Case Management/Adjunct)  Date:  03/25/2014  Time:  11:29 AM  Type of Therapy:  Psychoeducational Skills- Patient Self Inventory Group  Participation Level:  Did Not Attend   Type of Therapy:  Psychoeducational Skills-Healthy Support Systems  Participation Level:  Did Not Attend   Mario [name] 03/25/2014, 11:29 AM 

## 2014-03-25 NOTE — Progress Notes (Signed)
Patient ID: Mario [name], male   DOB: [birth year], 23 y.o.   MRN: [id number] Patient ID: Mario [name], male   DOB: 02/22/1991, 23 y.o.   MRN: [id number] Patient ID: Mario [name], male   DOB: 22-May-1991, 23 y.o.   MRN: [id number] [facility] MD Progress Note  03/25/2014 3:32 PM Mario [name]  MRN:  [id number]  Subjective: Mario [name] reports, "[name] doing fine"  Objective: Pt seen and chart reviewed. Pt was seen in his room while lying in his bed. He remains disheveled and not taking care of ADLs. He self isolates in his room.  Patient continues to lack insight to his problems. He  is compliant with his medications. Denies SI/HI/AH/VH. Patient however continues to be guarded and suspicious ,eventhough his speech has improved and is more spontaneous. Patient would not allowed writer to examine his wounds this afternoon.  he has multiple superficial wounds to his right thigh., wound care consulted nurse, Armanie refused  for his wound to be assessed and or evaluated. He now on antibiotics for prevention of wound infection.  Diagnosis:   DSM5: Primary psychiatric diagnosis:  Schizophrenia ,Multiple episodes ,currently in acute episodes   Secondary Psychiatric diagnosis;  Cannabis use disorder   Non -psychiatric diagnosis: Wound on leg  Total Time spent with patient: 30 minutes  ADL's:  Impaired  Sleep: Good  Appetite:  Good  Suicidal Ideation:  Denies ,but was wandering streets and is a danger to self at this time.  Homicidal Ideation:  Denies  Psychiatric Specialty Exam: Physical Exam  Constitutional: He is oriented to person, place, and time. He appears well-developed and well-nourished.  HENT:  Head: Normocephalic and atraumatic.  Neck: Normal range of motion.  Neurological: He is alert and oriented to person, place, and time.    Review of Systems  Constitutional: Negative.   HENT: Negative.   Eyes: Negative.   Respiratory: Negative.   Cardiovascular: Negative.    Gastrointestinal: Negative.   Genitourinary: Negative.   Musculoskeletal: Negative.   Skin: Negative.        Has a lesion on his upper right anterior thigh-lesions is superficial ,pinkish ,no discharge noted  Neurological: Negative.   Endo/Heme/Allergies: Negative.   Psychiatric/Behavioral: Positive for substance abuse. Negative for suicidal ideas (but continues to be disorganized ,has been wandering streets before coming in to hospital). The patient is nervous/anxious.     Blood pressure 128/88, pulse 63, temperature 97.8 F (36.6 C), temperature source Oral, resp. rate 16, height  (1.676 m), weight 68.947 kg (152 lb), SpO2 99.00%.Body mass index is 24.55 kg/(m^2).  General Appearance: Disheveled,not taking showers,not changing clothes,needs a lot of encouragement to do so. Patient continues to be guarded.  Eye Contact::  Fair  Speech:  Slow improved  Volume:  Normal  Mood:  Depressed  Affect:  Restricted  Thought Process:  Coherent  Orientation:  Other:  to person and place  Thought Content:  Delusions and Paranoid Ideation   Suicidal Thoughts:  No ,but was wandering the streets ,and laying in his own urine before coming in to hospital  Homicidal Thoughts:  No  Memory:  Immediate;   Fair Recent;   Poor Remote;   Poor  Judgement:  Impaired  Insight:  Lacking  Psychomotor Activity:  Decreased  Concentration:  Poor  Recall:  Poor  Fund of Knowledge:Poor  Language: Fair  Akathisia:  No    AIMS (if indicated):   0  Assets:  Social Support  Sleep:  Number of Hours:  6.75   Musculoskeletal: Strength & Muscle Tone: within normal limits Gait & Station: normal Patient leans: N/A  Current Medications: Current Facility-Administered Medications  Medication Dose Route Frequency Provider Last Rate Last Dose  . ARIPiprazole (ABILIFY) tablet 5 mg  5 mg Oral Daily Jomarie Longs, MD   5 mg at 03/25/14 0817  . bacitracin ointment 1 application  1 application Topical BID Lise Auer  Piepenbrink, PA-C   1 application at 03/18/14 0802  . buPROPion (WELLBUTRIN XL) 24 hr tablet 150 mg  150 mg Oral Daily Jomarie Longs, MD   150 mg at 03/25/14 0817  . cephALEXin (KEFLEX) capsule 500 mg  500 mg Oral Q12H Sanjuana Kava, NP   500 mg at 03/25/14 1218  . diphenhydrAMINE (BENADRYL) capsule 25 mg  25 mg Oral QPM Jomarie Longs, MD   25 mg at 03/24/14 1817  . diphenhydrAMINE (BENADRYL) injection 50 mg  50 mg Intramuscular Once Saramma Eappen, MD      . diphenhydrAMINE (BENADRYL) injection 50 mg  50 mg Intramuscular Q8H PRN Sanjuana Kava, NP      . haloperidol (HALDOL) tablet 25 mg  25 mg Oral QPM Saramma Eappen, MD   25 mg at 03/24/14 1817  . haloperidol lactate (HALDOL) injection 10 mg  10 mg Intramuscular Q8H PRN Sanjuana Kava, NP      . lamoTRIgine (LAMICTAL) tablet 100 mg  100 mg Oral BID Jomarie Longs, MD   100 mg at 03/25/14 0817  . LORazepam (ATIVAN) injection 2 mg  2 mg Intramuscular Once [employer], PA-C      . LORazepam (ATIVAN) injection 2 mg  2 mg Intramuscular Q8H PRN Sanjuana Kava, NP      . magnesium hydroxide (MILK OF MAGNESIA) suspension 30 mL  30 mL Oral Daily PRN Shuvon Rankin, NP      . OLANZapine zydis (ZYPREXA) disintegrating tablet 5 mg  5 mg Oral Once Jomarie Longs, MD      . traZODone (DESYREL) tablet 50 mg  50 mg Oral QHS PRN Jomarie Longs, MD        Lab Results: No results found for this or any previous visit (from the past 48 hour(s)).  Physical Findings: AIMS: Facial and Oral Movements Muscles of Facial Expression: None, normal Lips and Perioral Area: None, normal Jaw: None, normal Tongue: None, normal,Extremity Movements Upper (arms, wrists, hands, fingers): None, normal Lower (legs, knees, ankles, toes): None, normal, Trunk Movements Neck, shoulders, hips: None, normal, Overall Severity Severity of abnormal movements (highest score from questions above): None, normal Incapacitation due to abnormal movements: None, normal Patient's awareness  of abnormal movements (rate only patient's report): No Awareness, Dental Status Current problems with teeth and/or dentures?: No Does patient usually wear dentures?: No  CIWA:    COWS:     Treatment Plan Summary: Daily contact with patient to assess and evaluate symptoms and progress in treatment Medication management  Plan: Continue crisis management/mood stabilization treatment . 2. Continue Haldol to  25 mg po daily for mood control, Benadryl  25 mg po qhs for EPS, Abilify 5 mg po daily for mood control, Lamictal to 100 mg po bid for mood stabilization, Wellbutrin XL 150 mg for motivation as well as energy, Trazodone 50 mg po qhs prn for sleep.  3. Wound care consulted for wound on thigh areas, Jaydan refused to be seen . 4. CSW will work on disposition.  5. Initiate Keflex 500 mg bid x 4 days for wound inner  left thigh. 6. Patient to participate in milieu/attend groups.   Medical Decision Making  Problem Points:  Established problem, stable/improving (1) and Review of psycho-social stressors (1) Data Points:  Order Aims Assessment (2) Review of medication regiment & side effects (2) Review of new medications or change in dosage (2)  [name] certify that inpatient services furnished can reasonably be expected to improve the patient's condition.   Mario [name], Mario [name]. 03/25/2014, 3:32 PM

## 2014-03-25 NOTE — Progress Notes (Signed)
Patient ID: PHILLIPS GOULETTE, male   DOB: 1991/02/17, 23 y.o.   MRN: [id number] D. Patient presents with depressed mood, affect flat. His thoughts remain disorganized, and Ranard remains very resistant to care. Casey continues to be isolative to his room, and his hygiene is very poor. He continues to refuse to allow staff to treat his leg. Staff continue to encourage patient to clean leg wound and bathe and he states ''it's fine. Don't mess with me''  A . Discussed above information with A. Nwoko NP , and patients continued behaviors... R. Patient continues to be isolative, in his room lying in bed. He has been going to meals in dining hall with no issue. No further voiced concerns at this time. Will continue to monitor q 15 minutes for safety.

## 2014-03-26 DIAGNOSIS — T148XXA Other injury of unspecified body region, initial encounter: Secondary | ICD-10-CM | POA: Diagnosis present

## 2014-03-26 DIAGNOSIS — R238 Other skin changes: Secondary | ICD-10-CM

## 2014-03-26 MED ORDER — HALOPERIDOL 5 MG PO TABS
10.0000 mg | ORAL_TABLET | Freq: Three times a day (TID) | ORAL | Status: DC
  Administered 2014-03-27 – 2014-03-29 (×7): 10 mg via ORAL
  Filled 2014-03-26 (×12): qty 2

## 2014-03-26 MED ORDER — DIPHENHYDRAMINE HCL 25 MG PO CAPS
25.0000 mg | ORAL_CAPSULE | Freq: Three times a day (TID) | ORAL | Status: DC
  Administered 2014-03-27: 25 mg via ORAL
  Filled 2014-03-26 (×8): qty 1

## 2014-03-26 MED ORDER — HALOPERIDOL 5 MG PO TABS
ORAL_TABLET | ORAL | Status: AC
  Filled 2014-03-26: qty 1

## 2014-03-26 MED ORDER — HALOPERIDOL 5 MG PO TABS
30.0000 mg | ORAL_TABLET | Freq: Once | ORAL | Status: AC
  Administered 2014-03-26: 30 mg via ORAL
  Filled 2014-03-26: qty 6

## 2014-03-26 MED ORDER — HALOPERIDOL 5 MG PO TABS: 30.0000 mg | ORAL_TABLET | Freq: Every evening | ORAL | Status: DC

## 2014-03-26 MED ORDER — ARIPIPRAZOLE 5 MG PO TABS
5.0000 mg | ORAL_TABLET | Freq: Two times a day (BID) | ORAL | Status: DC
  Administered 2014-03-26 – 2014-03-27 (×2): 5 mg via ORAL
  Filled 2014-03-26 (×5): qty 1

## 2014-03-26 MED ORDER — DIPHENHYDRAMINE HCL 50 MG PO CAPS: 50.0000 mg | ORAL_CAPSULE | Freq: Every evening | ORAL | Status: DC

## 2014-03-26 MED ORDER — DIPHENHYDRAMINE HCL 25 MG PO CAPS
25.0000 mg | ORAL_CAPSULE | Freq: Once | ORAL | Status: AC
  Administered 2014-03-26: 25 mg via ORAL
  Filled 2014-03-26: qty 1

## 2014-03-26 NOTE — Progress Notes (Signed)
D: Pt presents with flat affect, paranoia, disorganized thoughts and tangential speech. Pt has poor insight and is requesting discharge. Pt easily agitated and irritable. Writer have observed pt sitting in the dayroom throughout the day asleep, not participating and no engagement. Pt has poor hygiene and body odor. Pt refuses topical ointment and dressings to open wounds on right anterior thigh. Per pt, it's healing naturally. Pt allowed internal medicine to assess him this morning but refused to allow anyone else to assess his wounds.  A: Medications administered as ordered per MD. Verbal support given. Pt encouraged to engage in groups. Writer encouraged pt to allow writer to apply a foam dressing to his wounds as ordered per MD. Pt requires redirecting by staff for inappropriate and bizarre behaviors. 15 minute checks performed for safety.  R: Pt safety maintained at this time.

## 2014-03-26 NOTE — Tx Team (Signed)
Interdisciplinary Treatment Plan Update (Adult)   Date: 03/26/2014   Time Reviewed: 10:23 AM  Progress in Treatment:  Attending groups: Yes  Participating in groups: Minimally Taking medication as prescribed: Yes  Tolerating medication: Yes  Family/Significant othe contact made: SPE completed and collateral info received from pt's mother.  Patient understands diagnosis: Partially, AEB seeking treatment for medication stabilization. Pt demonstrates limited insight at this time, minimally engaged with staff who are attempting to ask him questions. Discussing patient identified problems/goals with staff: Yes  Medical problems stabilized or resolved: Yes  Denies suicidal/homicidal ideation: Yes during admission/self report  Patient has not harmed self or Others: Yes  New problem(s) identified: Discharge Plan or Barriers: Pt will return home to his mother and go to [facility] for med management/assessment for therapy services. Pt denied for ACT services at [facility] due to lack of funding and no medicaid. CSW referred pt for Care Coordination through [facility] and an intake coordinator will contact pt and his mother within 5 days.   Additional comments:  Pt is now allowing MD to look at his leg wound. Heavily focused on d/c.     Reason for Continuation of Hospitalization: Mood stabilization Med management  Estimated length of stay: 1-3 days  For review of initial/current patient goals, please see plan of care.  Attendees:  Patient:    Family:    Physician: Dr. Elna Breslow MD 03/26/2014 10:23 AM   Nursing: Rodell Perna RN 03/26/2014 10:23 AM   Clinical Social Worker Ava Deguire Smart, LCSWA  03/26/2014 10:23 AM   Other: Daryel Gerald, LCSW 03/26/2014 10:23 AM   Other: Meryl Dare RN 03/26/2014 10:23 AM   Other:   03/26/2014 10:23 AM   Other:  03/26/2014 10:23 AM   Scribe for Treatment Team:  Herbert Seta Smart LCSWA 03/26/2014 10:23 AM

## 2014-03-26 NOTE — Consult Note (Addendum)
[facility] Medical Consultation  Mario [name] ZOX:[id number] DOB: 02-06-1991 DOA: 03/14/2014 PCP: No primary provider on file.   Requesting physician: Dr. Jomarie Longs, Psychiatry Date of consultation: 03/26/14 Reason for consultation: Evaluation and management of right thigh wounds.  Impression/Recommendations Active Problems:   Psychotic disorder   Multiple wounds of skin    1. Right thigh wounds: Patient has multiple superficial wounds on the right thigh (likely abrasions) which are clean and healing well without signs of acute infection at this time. Recommend patient not pick on these wounds and dry dressing to avoid trauma to this area. May DC antibiotics. Discussed plan in person with referring M.D at bedside. 2. Asthma: Patient states that he outgrew this. Stable. 3. Substance abuse/THC: Defer management per primary team.   TR[facility] will sign off at this time. Please consult [place] back for any further assistance. Thank you for this consultation.  Chief Complaint: Right thigh wounds.  HPI:  23 year old male patient with history of childhood asthma, substance abuse/THC, schizophrenia, admitted to the behavioral health Center for management of acute schizophrenia. Hospitalist service was consulted to evaluate right thigh wounds. Patient apparently has poor social and living conditions, poor insight into his own health and well-being, wanders the streets, does not shower or clean himself regularly and at times has clothes stuck to the skin. Patient cannot tell us how he sustained the wounds on his right thigh. He denies pain at this time. No fevers.  Review of Systems:  All systems reviewed and apart from history of presenting illness, negative.  Past Medical History  Diagnosis Date  . Bipolar 1 disorder   . Schizophrenia    History reviewed. No pertinent past surgical history. Social History:  reports that he has been smoking.  He does not have any smokeless tobacco  history on file. He reports that he uses illicit drugs (Marijuana). He reports that he does not drink alcohol. Patient is unable to provide details regarding his family history  Allergies  Allergen Reactions  . Carbamazepine     Face swelling  . Cogentin [Benztropine]   . Prolixin [Fluphenazine]    History reviewed. No pertinent family history.  Prior to Admission medications   Medication Sig Start Date End Date Taking? Authorizing Provider  benztropine (COGENTIN) 0.5 MG tablet Take 1 tablet (0.5 mg total) by mouth 2 (two) times daily. 02/17/13   Fransisca Kaufmann, NP  carbamazepine (TEGRETOL) 200 MG tablet Take 1 tablet (200 mg total) by mouth 2 (two) times daily after a meal. For mood stability. 02/17/13   Fransisca Kaufmann, NP  diphenhydrAMINE (BENADRYL) 25 MG tablet Take 1 tablet (25 mg total) by mouth every 6 (six) hours as needed for itching or allergies (Rash). 03/15/14   Jennifer L Piepenbrink, PA-C  fluPHENAZine (PROLIXIN) 5 MG tablet Take 1 tablet (5 mg total) by mouth 2 (two) times daily after a meal. 02/17/13   Fransisca Kaufmann, NP  hydrOXYzine (ATARAX/VISTARIL) 25 MG tablet Take 1 tablet (25 mg total) by mouth at bedtime as needed (sleep). 02/17/13   Fransisca Kaufmann, NP   Physical Exam: Blood pressure 131/80, pulse 94, temperature 98.2 F (36.8 C), temperature source Oral, resp. rate 18, height  (1.676 m), weight 68.947 kg (152 lb), SpO2 99.00%. Filed Vitals:   03/26/14 0726  BP: 131/80  Pulse: 94  Temp:   Resp:      General:  Moderately built and nourished young disheveled male, sitting comfortably at edge of bed.  Eyes: Pupils equally reacting to  light and accommodation.  ENT: No acute findings.  Neck: Supple. No JVD or carotid bruit.  Cardiovascular: S1 and S2 heard, RRR. No JVD, murmurs or pedal edema.  Respiratory: Clear to auscultation. No increased work of breathing.  Abdomen: Nondistended, soft and nontender. Normal bowel sounds heard.  Skin: Patient has 4 superficial  small ulcers of varying sizes over right mid anterior thigh all of which are clean, dry with pink granulation tissue and no features of acute infection. Nontender.  Musculoskeletal: No acute findings.  Psychiatric: Flat affect.  Neurologic: Alert and oriented x3. No focal deficits.  Labs on Admission:  Recent lab work including CMP and CBC on 03/13/14 are unremarkable. Serum salicylate and acetaminophen levels were negative. Blood alcohol level was negative. UDS was positive for THC.  Radiological Exams on Admission: No results found.     Time spent: 45 minutes  [facility] Pager [phone]  If 7PM-7AM, please contact night-coverage [email or web address] Password [facility] 03/26/2014, 6:28 PM

## 2014-03-26 NOTE — Progress Notes (Signed)
Patient ID: Mario [name], male   DOB: 11-Sep-1990, 23 y.o.   MRN: [id number] Patient ID: Mario [name], male   DOB: 06/10/1991, 23 y.o.   MRN: [id number] [facility] MD Progress Note  03/26/2014 12:27 PM Mario [name]  MRN:  [id number]  Subjective: Mario [name], "I'm doing ok ,I want to go home"  Objective: Pt seen and chart reviewed. Pt today continues to be fixed on discharge. Patient continues to be disorganized with minimally taking care of his ADLs . Patient has a foul smell and has difficulty changing his clothes even when he is asked to do so. Patient lack insight or judgement and has trouble with redirection. Patient continue to deny  SI/HI/AH/VH. Patient however continues to be guarded and suspicious . His  speech has improved and he is able to verbalize his feelings more today. Patient wants to go to court to get himself out of here.   Patient has a wound on his right upper anterior part of his thigh which he reportedly got from urinating on himself and laying on his urine. Patient allowed writer to take a look at it.  Hospitalist consult was placed and wound was examined in writer's presence by hospitalist - Per Hospitalist -wound is healing and very superficial. No antibiotics necessary at this point ,but could continue the anitbiotics that were started over the weekend to complete the course.  No dressing necessary .   Spoke to [employer] who is Arts administrator - who [name] patient has been allowed to stay another 7 days (until Tuesday ,29 th September) per court.     Diagnosis:   DSM5: Primary psychiatric diagnosis:  Schizophrenia ,Multiple episodes ,currently in acute episodes   Secondary Psychiatric diagnosis;  Cannabis use disorder   Non -psychiatric diagnosis: Wound on leg  Total Time spent with patient: 30 minutes  ADL's:  Impaired  Sleep: Good  Appetite:  Good  Suicidal Ideation:  Denies ,but was wandering streets and is a danger to self at this  time.  Homicidal Ideation:  Denies  Psychiatric Specialty Exam: Physical Exam  Constitutional: He is oriented to person, place, and time. He appears well-developed and well-nourished.  HENT:  Head: Normocephalic and atraumatic.  Neck: Normal range of motion.  Neurological: He is alert and oriented to person, place, and time.    Review of Systems  Constitutional: Negative.   HENT: Negative.   Eyes: Negative.   Respiratory: Negative.   Cardiovascular: Negative.   Gastrointestinal: Negative.   Genitourinary: Negative.   Musculoskeletal: Negative.   Skin: Negative.        Has a lesion on his upper right anterior thigh-lesions is superficial ,pinkish ,no discharge noted  Neurological: Negative.   Endo/Heme/Allergies: Negative.   Psychiatric/Behavioral: Positive for substance abuse. Negative for suicidal ideas (but continues to be disorganized ,has been wandering streets before coming in to [name]). The patient is nervous/anxious.     Blood pressure 131/80, pulse 94, temperature 98.2 F (36.8 C), temperature source Oral, resp. rate 18, height  (1.676 m), weight 68.947 kg (152 lb), SpO2 99.00%.Body mass index is 24.55 kg/(m^2).  General Appearance: Disheveled,not taking showers,not changing clothes,needs a lot of encouragement to do so. Patient continues to be guarded with some improvement  Eye Contact::  Fair  Speech:  Slow improved  Volume:  Normal  Mood:  Depressed  Affect:  Restricted  Thought Process:  Coherent  Orientation:  Other:  to person and place  Thought Content:  Delusions and Paranoid  Ideation   Suicidal Thoughts:  No ,but was wandering the streets ,and laying in his own urine before coming in to [name]  Homicidal Thoughts:  No  Memory:  Immediate;   Fair Recent;   Poor Remote;   Poor  Judgement:  Impaired  Insight:  Lacking  Psychomotor Activity:  Decreased  Concentration:  Poor  Recall:  Poor  Fund of Knowledge:Poor  Language: Fair  Akathisia:  No     AIMS (if indicated):   0  Assets:  Social Support  Sleep:  Number of Hours: 6.5   Musculoskeletal: Strength & Muscle Tone: within normal limits Gait & Station: normal Patient leans: N/A  Current Medications: Current Facility-Administered Medications  Medication Dose Route Frequency Provider Last Rate Last Dose  . ARIPiprazole (ABILIFY) tablet 5 mg  5 mg Oral BID Jomarie Longs, MD      . bacitracin ointment 1 application  1 application Topical BID Lise Auer Piepenbrink, PA-C   1 application at 03/18/14 0802  . buPROPion (WELLBUTRIN XL) 24 hr tablet 150 mg  150 mg Oral Daily Jomarie Longs, MD   150 mg at 03/26/14 0811  . cephALEXin (KEFLEX) capsule 500 mg  500 mg Oral Q12H Sanjuana Kava, NP   500 mg at 03/26/14 0811  . diphenhydrAMINE (BENADRYL) capsule 25 mg  25 mg Oral Once Jomarie Longs, MD      . Melene Muller ON 03/27/2014] diphenhydrAMINE (BENADRYL) capsule 25 mg  25 mg Oral TID Jomarie Longs, MD      . diphenhydrAMINE (BENADRYL) injection 50 mg  50 mg Intramuscular Once Jomarie Longs, MD      . diphenhydrAMINE (BENADRYL) injection 50 mg  50 mg Intramuscular Q8H PRN Sanjuana Kava, NP      . Melene Muller ON 03/27/2014] haloperidol (HALDOL) tablet 10 mg  10 mg Oral TID Jomarie Longs, MD      . haloperidol (HALDOL) tablet 30 mg  30 mg Oral Once Jomarie Longs, MD      . haloperidol lactate (HALDOL) injection 10 mg  10 mg Intramuscular Q8H PRN Sanjuana Kava, NP      . lamoTRIgine (LAMICTAL) tablet 100 mg  100 mg Oral BID Jomarie Longs, MD   100 mg at 03/26/14 0811  . LORazepam (ATIVAN) injection 2 mg  2 mg Intramuscular Once [employer], PA-C      . LORazepam (ATIVAN) injection 2 mg  2 mg Intramuscular Q8H PRN Sanjuana Kava, NP      . magnesium hydroxide (MILK OF MAGNESIA) suspension 30 mL  30 mL Oral Daily PRN Shuvon Rankin, NP      . OLANZapine zydis (ZYPREXA) disintegrating tablet 5 mg  5 mg Oral Once Jomarie Longs, MD      . traZODone (DESYREL) tablet 50 mg  50 mg Oral QHS PRN  Jomarie Longs, MD        Lab Results: No results found for this or any previous visit (from the past 48 hour(s)).  Physical Findings: AIMS: Facial and Oral Movements Muscles of Facial Expression: None, normal Lips and Perioral Area: None, normal Jaw: None, normal Tongue: None, normal,Extremity Movements Upper (arms, wrists, hands, fingers): None, normal Lower (legs, knees, ankles, toes): None, normal, Trunk Movements Neck, shoulders, hips: None, normal, Overall Severity Severity of abnormal movements (highest score from questions above): None, normal Incapacitation due to abnormal movements: None, normal Patient's awareness of abnormal movements (rate only patient's report): No Awareness, Dental Status Current problems with teeth and/or dentures?: No Does patient  usually wear dentures?: No  CIWA:    COWS:     Treatment Plan Summary: Daily contact with patient to assess and evaluate symptoms and progress in treatment Medication management  Plan: Continue crisis management/mood stabilization treatment .  Increase Haldol to 30 mg po daily for mood control, Benadryl  25 mg po  for EPS.  Increase Abilify to 10 mg po daily for mood control,could titrate dose higher tomorrow. Continue  Lamictal 100 mg po bid for mood stabilization,  Wellbutrin XL 150 mg for motivation as well as energy, Trazodone 50 mg po qhs prn for sleep.  Patient seen by hospitalist, (see above) - per consult - wound healing ,no antibiotics necessary at this point. CSW will work on disposition.  Patient to participate in milieu/attend groups.     Medical Decision Making  Problem Points:  Established problem, stable/improving (1) and Review of psycho-social stressors (1) Data Points:  Order Aims Assessment (2) Review of medication regiment & side effects (2) Review of new medications or change in dosage (2)  I certify that inpatient services furnished can reasonably be expected to improve the patient's  condition.   Violanda Bobeck,MD  03/26/2014, 12:27 PM

## 2014-03-26 NOTE — Consult Note (Signed)
Requested WOC consult for second time today.  I contacted bedside nursing staff that I would come over for evaluation of the leg/thigh wounds however upon my arrival they have also consulted "hospitalist" to evaluate the patient.  Noted that Dr. Shea Evans had met with hospitalist at the bedside for wound evaluation and the MD has suggested no topical care only systemic antibiotics.  I tried to see the patient and explained that I was a wound specialist and we just wanted to make sure that we are doing all the right treatments for him in order to get this area well.  He is still nervous with me and will not allow me to see the wound. The nurse and I talked with the patient for several minutes but he reports that MD has seen the wound already today and that he has been told it is "ok" and does not need anything put on it.   Was not able to assess wound, patient would not allow.    Discussed POC with patient and bedside nurse.  Re consult if needed, will not follow at this time. Thanks  Taneshia Lorence Kellogg, [place] [phone])

## 2014-03-26 NOTE — Progress Notes (Signed)
Patient ID: Mario [name], male   DOB: June 24, 1991, 23 y.o.   MRN: [id number] D: client in bed, visiting with family this evening. Client denies AVH, but clearly preoccupied, blocking. Client in room this evening, no interaction with peers, isolates. A: Writer introduced self to client, encouraged conversation, inquired of his day. "doing all right"  Staff will monitor q36min for safety. R: Client is safe on the unit, does not attend group.

## 2014-03-26 NOTE — Progress Notes (Addendum)
Patient ID: Mario [name], male   DOB: Jul 03, 1991, 23 y.o.   MRN: [id number] D)  Has spent most of the evening in bed, in his room.  Was sleeping at the beginning of the shift, Keflex was taken to him as he didn't feel like getting out of bed.  Has remained isolative all evening, affect flat, disheveled, limited interaction.  Stated he might come to the dayroom for group, but said he had changed his mind and didn't want to come.  The light was left on in the bathroom for him and he told me to come back and turn it off, was going back to sleep. A)  Will continue to monitor for safety, continue POC R)  Safety maintained.

## 2014-03-26 NOTE — [facility] Group Notes (Signed)
[facility] LCSW Aftercare Discharge Planning Group Note   03/26/2014 10:20 AM  Participation Quality:  Minimal   Mood/Affect:  Lethargic  Depression Rating:  0  Anxiety Rating:  0  Thoughts of Suicide:  No Will you contract for safety?   NA  Current AVH:  No  Plan for Discharge/Comments:  Pt sleeping during majority of group. He woke up and smiled when his name was called by CSW. When asked how he was feeling, pt laughed and stated that he felt great. He stated that he wanted to d/c today and would return home to live with his parents. Pt reports that his meds are working well and he is sleeping well. Pt appeared disheveled and was wearing hospital gown. Focused on d/c today. CSW stated that he would have to speak with MD about a d/c date. CSW called [facility] on Friday to refer pt for Care Coordinator. His mother will be contacted within 5 days.   Transportation Means: pt's mother  Supports: pt's mother   Smart, Lebron Quam

## 2014-03-26 NOTE — [facility] Group Notes (Signed)
[facility] LCSW Group Therapy  03/26/2014 3:17 PM  Type of Therapy:  Group Therapy  Participation Level:  Minimal  Participation Quality:  Drowsy  Affect:  Blunted  Cognitive:  Lacking  Insight:  Limited  Engagement in Therapy:  Limited  Modes of Intervention:  Confrontation, Discussion, Education, Exploration, Limit-setting, Problem-solving, Rapport Building, Socialization and Support  Summary of Progress/Problems: Today's Topic: Overcoming Obstacles. Pt identified obstacles faced currently and processed barriers involved in overcoming these obstacles. Pt identified steps necessary for overcoming these obstacles and explored motivation (internal and external) for facing these difficulties head on. Pt further identified one area of concern in their lives and chose a skill of focus pulled from their "toolbox." Mario [name] was lethargic and disengaged during most of today's processing group. He was able to contribute to discussion when called upon by CSW. Mario [name] shared that an obstacle he overcame involved "not dealing with certain people." When asked to elaborate, he was able to articulate his obstacle and how he managed to overcome dealing with difficult people in his life. "I still am nice but I don't let some people get close that are not good for me. I deal with them on a different level." Mario [name] continues to demonstrate limited insight and limited progress in the group setting. He managed to remain on topic when he participated.    Smart, Roxi Hlavaty LCSWA  03/26/2014, 3:17 PM

## 2014-03-27 MED ORDER — BENZTROPINE MESYLATE 0.5 MG PO TABS
0.5000 mg | ORAL_TABLET | Freq: Once | ORAL | Status: AC
  Administered 2014-03-27: 0.5 mg via ORAL
  Filled 2014-03-27 (×2): qty 1

## 2014-03-27 MED ORDER — BENZTROPINE MESYLATE 0.5 MG PO TABS
0.5000 mg | ORAL_TABLET | Freq: Three times a day (TID) | ORAL | Status: DC
  Administered 2014-03-27 – 2014-03-29 (×5): 0.5 mg via ORAL
  Filled 2014-03-27 (×10): qty 1

## 2014-03-27 MED ORDER — ARIPIPRAZOLE 10 MG PO TABS
10.0000 mg | ORAL_TABLET | Freq: Two times a day (BID) | ORAL | Status: DC
  Administered 2014-03-27 – 2014-03-30 (×6): 10 mg via ORAL
  Filled 2014-03-27 (×4): qty 1
  Filled 2014-03-27: qty 28
  Filled 2014-03-27: qty 1
  Filled 2014-03-27: qty 28
  Filled 2014-03-27 (×4): qty 1

## 2014-03-27 NOTE — [facility] Group Notes (Signed)
[facility] LCSW Group Therapy  03/27/2014 , 1:29 PM   Type of Therapy:  Group Therapy  Participation Level:  Active  Participation Quality:  Attentive  Affect:  Appropriate  Cognitive:  Alert  Insight:  Improving  Engagement in Therapy:  Engaged  Modes of Intervention:  Discussion, Exploration and Socialization  Summary of Progress/Problems: Today's group focused on the term Diagnosis.  Participants were asked to define the term, and then pronounce whether it is a negative, positive or neutral term.  For the second time this week, [name] was in group participating, and stayed for the entire time.  He said his experience with diagnosis is a good one as people do not treat him differently, and it gives him a way of understanding this symptoms that were otherwise perplexing.  He stares straight ahead the whole time, and does not offer anything spontaneously, but his behavior and actions are still an improvement over the past couple of weeks.  Daryel Gerald B 03/27/2014 , 1:29 PM

## 2014-03-27 NOTE — Progress Notes (Signed)
D: Pt presents with flat affect and depressed mood. Pt denies SI/HI/AVH. Pt cautious during shift assessment and suspicious. Pt appears a little paranoid today. Pt appears to be making progress. Pt visible on the milieu and compliant with taking meds. Pt less disorganized.  A: Medications administered as ordered per MD. Verbal support given. Pt encouraged to attend groups. Pt encouraged to improve on ADLs. 15 minute checks performed for safety.  R: Pt safety maintained at this time. Pt calm and cooperative today.

## 2014-03-27 NOTE — Progress Notes (Signed)
Patient ID: Mario [name], male   DOB: 17-Jun-1991, 23 y.o.   MRN: [id number] Patient ID: Mario [name], male   DOB: February 25, 1991, 23 y.o.   MRN: [id number] [facility] MD Progress Note  03/27/2014 11:03 AM Mario [name]  MRN:  [id number]  Subjective: Mario [name] reports ," I am Ok ,when can I be discharged ?'   Objective: Pt seen and chart reviewed. Pt today was seen in treatment room. He was dressed in hospital gown. Reported his clothes are being washed . He reports taking a shower today. He appears to be more pleasant today ,smiles appropriately in between conversations and is more interactive.  Mario [name] continues to deny SI/HI/AH/VH. He continues to be guarded but is improving.  Per staff ,patient is attending groups and is more interactive in groups (per CSW). Nursing staff reports that patient had bedwetting last night. He does appear to be making some progress.  Writer examined his wound today - It continues to heal. Per Hospitalist consult -wound is healing and very superficial. No antibiotics necessary at this point ,but could continue the anitbiotics that were started over the weekend to complete the course.  No dressing necessary but dry dressing if needed . Patient continues to want to leave it open without dressing.  Spoke to [employer] who is Arts administrator - who reports patient has been allowed to stay another 7 days (until Tuesday ,29 th September) per court.       Diagnosis:   DSM5: Primary psychiatric diagnosis:  Schizophrenia ,Multiple episodes ,currently in acute episodes   Secondary Psychiatric diagnosis;  Cannabis use disorder   Non -psychiatric diagnosis: Wound on leg  Total Time spent with patient: 30 minutes  ADL's:  Impaired improving  Sleep: Good  Appetite:  Good  Suicidal Ideation:  Denies ,but was wandering streets and is a danger to self at this time.  Homicidal Ideation:  Denies  Psychiatric Specialty Exam: Physical Exam   Constitutional: He is oriented to person, place, and time. He appears well-developed and well-nourished.  HENT:  Head: Normocephalic and atraumatic.  Neck: Normal range of motion.  Neurological: He is alert and oriented to person, place, and time.    Review of Systems  Constitutional: Negative.   HENT: Negative.   Eyes: Negative.   Respiratory: Negative.   Cardiovascular: Negative.   Gastrointestinal: Negative.   Genitourinary: Negative.   Musculoskeletal: Negative.   Skin: Negative.        Has a lesion on his upper right anterior thigh-lesions is superficial ,pinkish ,no discharge noted  Neurological: Negative.   Endo/Heme/Allergies: Negative.   Psychiatric/Behavioral: Positive for substance abuse. Negative for suicidal ideas (has been wandering streets before coming in to hospital). The patient is nervous/anxious.     Blood pressure 116/86, pulse 98, temperature 98.2 F (36.8 C), temperature source Oral, resp. rate 18, height  (1.676 m), weight 68.947 kg (152 lb), SpO2 99.00%.Body mass index is 24.55 kg/(m^2).  General Appearance: Guarded,started taking showers and changing clothes,needs a lot of encouragement to do so. Patient continues to be guarded with some improvement  Eye Contact::  Good  Speech:  Slow improved  Volume:  Normal  Mood:  Anxious  Affect:  Appropriate smiles and is more pleasant  Thought Process:  Coherent  Orientation:  Other:  to person and place  Thought Content:  Delusions and Paranoid Ideation   Suicidal Thoughts:  No ,but was wandering the streets ,and laying in his own urine before coming in to hospital  Homicidal Thoughts:  No  Memory:  Immediate;   Fair Recent;   Fair Remote;   Fair  Judgement:  Impaired  Insight:  Lacking  Psychomotor Activity:  Decreased  Concentration:  Fair  Recall:  [employer] of Knowledge:Fair  Language: Good  Akathisia:  No    AIMS (if indicated):   0  Assets:  Social Support  Sleep:  Number of Hours: 6.75    Musculoskeletal: Strength & Muscle Tone: within normal limits Gait & Station: normal Patient leans: N/A  Current Medications: Current Facility-Administered Medications  Medication Dose Route Frequency Provider Last Rate Last Dose  . ARIPiprazole (ABILIFY) tablet 10 mg  10 mg Oral BID Jomarie Longs, MD      . bacitracin ointment 1 application  1 application Topical BID Lise Auer Piepenbrink, PA-C   1 application at 03/18/14 0802  . benztropine (COGENTIN) tablet 0.5 mg  0.5 mg Oral Once Yarrow Linhart, MD      . buPROPion (WELLBUTRIN XL) 24 hr tablet 150 mg  150 mg Oral Daily Jomarie Longs, MD   150 mg at 03/27/14 0810  . cephALEXin (KEFLEX) capsule 500 mg  500 mg Oral Q12H Sanjuana Kava, NP   500 mg at 03/27/14 0811  . diphenhydrAMINE (BENADRYL) capsule 25 mg  25 mg Oral TID Jomarie Longs, MD   25 mg at 03/27/14 0810  . diphenhydrAMINE (BENADRYL) injection 50 mg  50 mg Intramuscular Once Tajah Noguchi, MD      . diphenhydrAMINE (BENADRYL) injection 50 mg  50 mg Intramuscular Q8H PRN Sanjuana Kava, NP      . haloperidol (HALDOL) tablet 10 mg  10 mg Oral TID Jomarie Longs, MD   10 mg at 03/27/14 0810  . haloperidol lactate (HALDOL) injection 10 mg  10 mg Intramuscular Q8H PRN Sanjuana Kava, NP      . lamoTRIgine (LAMICTAL) tablet 100 mg  100 mg Oral BID Jomarie Longs, MD   100 mg at 03/27/14 0811  . LORazepam (ATIVAN) injection 2 mg  2 mg Intramuscular Once [employer], PA-C      . LORazepam (ATIVAN) injection 2 mg  2 mg Intramuscular Q8H PRN Sanjuana Kava, NP      . magnesium hydroxide (MILK OF MAGNESIA) suspension 30 mL  30 mL Oral Daily PRN Shuvon Rankin, NP      . OLANZapine zydis (ZYPREXA) disintegrating tablet 5 mg  5 mg Oral Once Jomarie Longs, MD      . traZODone (DESYREL) tablet 50 mg  50 mg Oral QHS PRN Jomarie Longs, MD        Lab Results: No results found for this or any previous visit (from the past 48 hour(s)).  Physical Findings: AIMS: Facial and Oral  Movements Muscles of Facial Expression: None, normal Lips and Perioral Area: None, normal Jaw: None, normal Tongue: None, normal,Extremity Movements Upper (arms, wrists, hands, fingers): None, normal Lower (legs, knees, ankles, toes): None, normal, Trunk Movements Neck, shoulders, hips: None, normal, Overall Severity Severity of abnormal movements (highest score from questions above): None, normal Incapacitation due to abnormal movements: None, normal Patient's awareness of abnormal movements (rate only patient's report): No Awareness, Dental Status Current problems with teeth and/or dentures?: No Does patient usually wear dentures?: No  CIWA:    COWS:     Treatment Plan Summary: Daily contact with patient to assess and evaluate symptoms and progress in treatment Medication management  Plan: Continue crisis management/mood stabilization treatment .  Continue Haldol  30 mg po daily for mood control. Will add Cogentin for EPS. Will DC benadryl since it makes him drowsy.Gave him a dose of cogentin and he did not report any allergies or side effects. Will remove from allergy list. But will continue to monitor.  Increase Abilify to 10 mg po bid  for mood control. Continue  Lamictal 100 mg po bid for mood stabilization,  Wellbutrin XL 150 mg for motivation as well as energy, Trazodone 50 mg po qhs prn for sleep.  Patient seen by hospitalist, (see above) - per consult - wound healing ,no antibiotics necessary at this point. CSW will work on disposition.  Patient to participate in milieu/attend groups.     Medical Decision Making  Problem Points:  Established problem, stable/improving (1) and Review of psycho-social stressors (1) Data Points:  Order Aims Assessment (2) Review of medication regiment & side effects (2) Review of new medications or change in dosage (2)  I certify that inpatient services furnished can reasonably be expected to improve the patient's condition.    Layman Gully,MD  03/27/2014, 11:03 AM

## 2014-03-28 LAB — CBC WITH DIFFERENTIAL/PLATELET
BASOS ABS: 0 10*3/uL (ref 0.0–0.1)
Basophils Relative: 0 % (ref 0–1)
EOS PCT: 2 % (ref 0–5)
Eosinophils Absolute: 0.1 10*3/uL (ref 0.0–0.7)
HEMATOCRIT: 40.3 % (ref 39.0–52.0)
HEMOGLOBIN: 12.8 g/dL — AB (ref 13.0–17.0)
LYMPHS PCT: 20 % (ref 12–46)
Lymphs Abs: 1.3 10*3/uL (ref 0.7–4.0)
MCH: 27.5 pg (ref 26.0–34.0)
MCHC: 31.8 g/dL (ref 30.0–36.0)
MCV: 86.5 fL (ref 78.0–100.0)
MONO ABS: 0.5 10*3/uL (ref 0.1–1.0)
Monocytes Relative: 8 % (ref 3–12)
Neutro Abs: 4.7 10*3/uL (ref 1.7–7.7)
Neutrophils Relative %: 70 % (ref 43–77)
Platelets: 263 10*3/uL (ref 150–400)
RBC: 4.66 MIL/uL (ref 4.22–5.81)
RDW: 12.4 % (ref 11.5–15.5)
WBC: 6.6 10*3/uL (ref 4.0–10.5)

## 2014-03-28 LAB — LIPID PANEL
Cholesterol: 207 mg/dL — ABNORMAL HIGH (ref 0–200)
HDL: 71 mg/dL (ref >39)
LDL CALC: 123 mg/dL — AB (ref 0–99)
Total CHOL/HDL Ratio: 2.9 RATIO
Triglycerides: 63 mg/dL (ref <150)
VLDL: 13 mg/dL (ref 0–40)

## 2014-03-28 LAB — HEMOGLOBIN A1C
Hgb A1c MFr Bld: 6 % — ABNORMAL HIGH (ref <5.7)
Mean Plasma Glucose: 126 mg/dL — ABNORMAL HIGH (ref <117)

## 2014-03-28 LAB — PROLACTIN: Prolactin: 23.5 ng/mL — ABNORMAL HIGH (ref 2.1–17.1)

## 2014-03-28 NOTE — Progress Notes (Signed)
D: Pt denies SI/HI/AVH. Pt continues to be evasive, forward little, but takes medication.   A: Pt was offered support and encouragement. Pt was given scheduled medications. Pt was encourage to attend groups. Q 15 minute checks were done for safety.   R:Pt attends groups and interacts well with peers and staff. Pt is taking medication. Pt has no complaints at this time.Pt receptive to treatment and safety maintained on unit.

## 2014-03-28 NOTE — [facility] Group Notes (Signed)
[facility] LCSW Group Therapy  03/28/2014 2:51 PM  Type of Therapy:  Group Therapy  Participation Level:  Minimal  Participation Quality:  Inattentive  Affect:  Flat  Cognitive:  Lacking  Insight:  Limited  Engagement in Therapy:  Limited  Modes of Intervention:  Discussion, Education, Socialization and Support  Summary of Progress/Problems: Emotion Regulation: This group focused on both positive and negative emotion identification and allowed group members to process ways to identify feelings, regulate negative emotions, and find healthy ways to manage internal/external emotions. Group members were asked to reflect on a time when their reaction to an emotion led to a negative outcome and explored how alternative responses using emotion regulation would have benefited them. Group members were also asked to discuss a time when emotion regulation was utilized when a negative emotion was experienced. Pt reported experiencing confusion recently. When he feels confused he seeks answers. He identified violence as a negative reaction to emotions while problem solving being a positive reaction. He identified his mother as someone he could rely on for support.   [facility], CSW Intern 03/28/2014, 2:51 PM

## 2014-03-28 NOTE — Progress Notes (Signed)
Patient ID: Mario [name], male   DOB: 1991-02-03, 23 y.o.   MRN: [id number] Patient ID: Mario [name], male   DOB: September 18, 1990, 23 y.o.   MRN: [id number] [facility] MD Progress Note  03/28/2014 12:30 PM Mario [name]  MRN:  [id number]  Subjective: Mario [name] reports ," I am feeling fine"   Objective: Pt seen and chart reviewed. Pt today was seen in his room today. Patient is more verbal which is quiet a change from his presentation a few days ago , when he was withdrawn and mute. Patient is more interactive ,smiles more and is able to respond to questions more appropriately.  He reports taking a shower today.  Mario [name] continues to deny SI/HI/AH/VH. He continues to be guarded but is improving.  Per staff ,patient is attending groups and is more interactive in groups (per CSW). Nursing staff reports that patient did not have any  bedwetting last night. He does appear to be making some progress.  Patient denies any pain with regards to his wound and reports no changes. Per Hospitalist consult -wound is healing and very superficial.      Per  Mario [name] who is Arts administrator - who reports patient has been allowed to stay until Tuesday ,29 th September, per court.       Diagnosis:   DSM5: Primary psychiatric diagnosis:  Schizophrenia ,Multiple episodes ,currently in acute episodes   Secondary Psychiatric diagnosis;  Cannabis use disorder   Non -psychiatric diagnosis: Wound on leg  Total Time spent with patient: 30 minutes  ADL's:  Impaired improving  Sleep: Good  Appetite:  Good  Suicidal Ideation:  Denies ,but was wandering streets and is a danger to self at this time.  Homicidal Ideation:  Denies  Psychiatric Specialty Exam: Physical Exam  Constitutional: He is oriented to person, place, and time. He appears well-developed and well-nourished.  HENT:  Head: Normocephalic and atraumatic.  Neck: Normal range of motion.  Neurological: He is alert and oriented to  person, place, and time.    Review of Systems  Constitutional: Negative.   HENT: Negative.   Eyes: Negative.   Respiratory: Negative.   Cardiovascular: Negative.   Gastrointestinal: Negative.   Genitourinary: Negative.   Musculoskeletal: Negative.   Skin: Negative.        Has a lesion on his upper right anterior thigh-lesions is superficial ,healing  Neurological: Negative.   Endo/Heme/Allergies: Negative.   Psychiatric/Behavioral: Positive for substance abuse. Negative for suicidal ideas (has been wandering streets before coming in to hospital). The patient is nervous/anxious.     Blood pressure 116/86, pulse 98, temperature 98.2 F (36.8 C), temperature source Oral, resp. rate 18, height  (1.676 m), weight 68.947 kg (152 lb), SpO2 99.00%.Body mass index is 24.55 kg/(m^2).  General Appearance: Guarded,started taking showers and changing clothes,needs a lot of encouragement to do so. Patient continues to be guarded with some improvement  Eye Contact::  Good  Speech:  Slow improved  Volume:  Normal  Mood:  Anxious  Affect:  Appropriate smiles and is more pleasant  Thought Process:  Coherent  Orientation:  Other:  to person and place  Thought Content:  Delusions and Paranoid Ideation   Suicidal Thoughts:  No ,but was wandering the streets ,and laying in his own urine before coming in to hospital  Homicidal Thoughts:  No  Memory:  Immediate;   Fair Recent;   Fair Remote;   Fair  Judgement:  Impaired  Insight:  Lacking  Psychomotor  Activity:  Decreased and improved  Concentration:  Fair  Recall:  [employer] of Knowledge:Fair  Language: Good  Akathisia:  No    AIMS (if indicated):   0  Assets:  Social Support  Sleep:  Number of Hours: 6.5   Musculoskeletal: Strength & Muscle Tone: within normal limits Gait & Station: normal Patient leans: N/A  Current Medications: Current Facility-Administered Medications  Medication Dose Route Frequency Provider Last Rate Last  Dose  . ARIPiprazole (ABILIFY) tablet 10 mg  10 mg Oral BID Jomarie Longs, MD   10 mg at 03/28/14 2956  . bacitracin ointment 1 application  1 application Topical BID Lise Auer Piepenbrink, PA-C   1 application at 03/18/14 0802  . benztropine (COGENTIN) tablet 0.5 mg  0.5 mg Oral TID Jomarie Longs, MD   0.5 mg at 03/28/14 1207  . buPROPion (WELLBUTRIN XL) 24 hr tablet 150 mg  150 mg Oral Daily Jomarie Longs, MD   150 mg at 03/28/14 0831  . cephALEXin (KEFLEX) capsule 500 mg  500 mg Oral Q12H Sanjuana Kava, NP   500 mg at 03/28/14 0831  . diphenhydrAMINE (BENADRYL) injection 50 mg  50 mg Intramuscular Once Zev Blue, MD      . diphenhydrAMINE (BENADRYL) injection 50 mg  50 mg Intramuscular Q8H PRN Sanjuana Kava, NP      . haloperidol (HALDOL) tablet 10 mg  10 mg Oral TID Jomarie Longs, MD   10 mg at 03/28/14 1207  . haloperidol lactate (HALDOL) injection 10 mg  10 mg Intramuscular Q8H PRN Sanjuana Kava, NP      . lamoTRIgine (LAMICTAL) tablet 100 mg  100 mg Oral BID Jomarie Longs, MD   100 mg at 03/28/14 0831  . LORazepam (ATIVAN) injection 2 mg  2 mg Intramuscular Once [employer], PA-C      . LORazepam (ATIVAN) injection 2 mg  2 mg Intramuscular Q8H PRN Sanjuana Kava, NP      . magnesium hydroxide (MILK OF MAGNESIA) suspension 30 mL  30 mL Oral Daily PRN Shuvon Rankin, NP      . OLANZapine zydis (ZYPREXA) disintegrating tablet 5 mg  5 mg Oral Once Jomarie Longs, MD      . traZODone (DESYREL) tablet 50 mg  50 mg Oral QHS PRN Jomarie Longs, MD        Lab Results:  Results for orders placed during the hospital encounter of 03/14/14 (from the past 48 hour(s))  CBC WITH DIFFERENTIAL     Status: Abnormal   Collection Time    03/28/14  6:38 AM      Result Value Ref Range   WBC 6.6  4.0 - 10.5 K/uL   RBC 4.66  4.22 - 5.81 MIL/uL   Hemoglobin 12.8 (*) 13.0 - 17.0 g/dL   HCT 21.3  08.6 - 57.8 %   MCV 86.5  78.0 - 100.0 fL   MCH 27.5  26.0 - 34.0 pg   MCHC 31.8  30.0 - 36.0 g/dL    RDW 46.9  62.9 - 52.8 %   Platelets 263  150 - 400 K/uL   Neutrophils Relative % 70  43 - 77 %   Neutro Abs 4.7  1.7 - 7.7 K/uL   Lymphocytes Relative 20  12 - 46 %   Lymphs Abs 1.3  0.7 - 4.0 K/uL   Monocytes Relative 8  3 - 12 %   Monocytes Absolute 0.5  0.1 - 1.0 K/uL  Eosinophils Relative 2  0 - 5 %   Eosinophils Absolute 0.1  0.0 - 0.7 K/uL   Basophils Relative 0  0 - 1 %   Basophils Absolute 0.0  0.0 - 0.1 K/uL   Comment: Performed at [facility]  HEMOGLOBIN A1C     Status: Abnormal   Collection Time    03/28/14  6:38 AM      Result Value Ref Range   Hemoglobin A1C 6.0 (*) <5.7 %   Comment: (NOTE)                                                                               According to the ADA Clinical Practice Recommendations for 2011, when     HbA1c is used as a screening test:      >=6.5%   Diagnostic of Diabetes Mellitus               (if abnormal result is confirmed)     5.7-6.4%   Increased risk of developing Diabetes Mellitus     References:Diagnosis and Classification of Diabetes Mellitus,Diabetes     Care,2011,34(Suppl 1):S62-S69 and Standards of Medical Care in             Diabetes - 2011,Diabetes Care,2011,34 (Suppl 1):S11-S61.   Mean Plasma Glucose 126 (*) <117 mg/dL   Comment: Performed at [facility]  LIPID PANEL     Status: Abnormal   Collection Time    03/28/14  6:38 AM      Result Value Ref Range   Cholesterol 207 (*) 0 - 200 mg/dL   Triglycerides 63  <161 mg/dL   HDL 71  >09 mg/dL   Total CHOL/HDL Ratio 2.9     VLDL 13  0 - 40 mg/dL   LDL Cholesterol 604 (*) 0 - 99 mg/dL   Comment:            Total Cholesterol/HDL:CHD Risk     Coronary Heart Disease Risk Table                         Men   Women      1/2 Average Risk   3.4   3.3      Average Risk       5.0   4.4      2 X Average Risk   9.6   7.1      3 X Average Risk  23.4   11.0                Use the calculated Patient Ratio     above and the CHD Risk Table      to determine the patient's CHD Risk.                ATP III CLASSIFICATION (LDL):      <100     mg/dL   Optimal      540-981  mg/dL   Near or Above                        Optimal      130-159  mg/dL  Borderline      160-189  mg/dL   High      >161     mg/dL   Very High     Performed at [facility]  PROLACTIN     Status: Abnormal   Collection Time    03/28/14  6:38 AM      Result Value Ref Range   Prolactin 23.5 (*) 2.1 - 17.1 ng/mL   Comment: (NOTE)         Reference Ranges:                     Male:                       2.1 -  17.1 ng/ml                     Male:   Pregnant          9.7 - 208.5 ng/mL                               Non Pregnant      2.8 -  29.2 ng/mL                               Post Menopausal   1.8 -  20.3 ng/mL                           Performed at [facility]    Physical Findings: AIMS: Facial and Oral Movements Muscles of Facial Expression: None, normal Lips and Perioral Area: None, normal Jaw: None, normal Tongue: None, normal,Extremity Movements Upper (arms, wrists, hands, fingers): None, normal Lower (legs, knees, ankles, toes): None, normal, Trunk Movements Neck, shoulders, hips: None, normal, Overall Severity Severity of abnormal movements (highest score from questions above): None, normal Incapacitation due to abnormal movements: None, normal Patient's awareness of abnormal movements (rate only patient's report): No Awareness, Dental Status Current problems with teeth and/or dentures?: No Does patient usually wear dentures?: No  CIWA:    COWS:     Treatment Plan Summary: Daily contact with patient to assess and evaluate symptoms and progress in treatment Medication management  Plan: Continue crisis management/mood stabilization treatment .  Continue Haldol  30 mg po daily for mood control. Will continue Cogentin for EPS.Will give Haldol Decanoate IM tomorrow . Continue Abilify  10 mg po bid  for mood  control. Continue  Lamictal 100 mg po bid for mood stabilization,  Wellbutrin XL 150 mg for motivation as well as energy, Trazodone 50 mg po qhs prn for sleep.  Patient seen by hospitalist, (see above) - per consult - wound healing ,no antibiotics necessary at this point. CSW will work on disposition. Per discussion with mother she will assist patient with medications and appointments once discharged. Referral to act team made ,patient will also have a case worker on discharge. Patient to participate in milieu/attend groups.     Medical Decision Making  Problem Points:  Established problem, stable/improving (1) and Review of psycho-social stressors (1) Data Points:  Order Aims Assessment (2) Review of medication regiment & side effects (2) Review of new medications or change in dosage (2)  I certify that inpatient services furnished can reasonably be expected to improve the patient's condition.  Reiko Vinje,MD  03/28/2014, 12:30 PM

## 2014-03-28 NOTE — Progress Notes (Signed)
Patient ID: Mario [name], male   DOB: Oct 19, 1990, 23 y.o.   MRN: [id number]  D: Pt. Denies SI/HI and A/V Hallucinations. Patient does not report any pain or discomfort at this time. Patient reports depression, anxiety, and hopelessness at 0/10 for the day. Patient is minimal with Clinical research associate. Writer, MD, and staff have continued to encourage patient to perform ADL's and take a shower but patient resists. Patient would not allow writer to look at his abrasion on leg today even though reinforced that it was necessary for his care. Patient reports he slept well last night, his appetite is good, energy level is normal and concentration is good.  A: Support and encouragement provided to the patient. Scheduled medications are administered to patient per physician's orders.  R: Patient is minimal and guarded. Patient tends to isolate in his room but is seen more in the milieu and is attending some groups. Q15 minute checks are maintained for safety.

## 2014-03-28 NOTE — [facility] Group Notes (Signed)
[facility] LCSW Aftercare Discharge Planning Group Note   03/28/2014 10:08 AM  Participation Quality:  Minimal   Mood/Affect:  Flat  Depression Rating:  0  Anxiety Rating:   0  Thoughts of Suicide:  No Will you contract for safety?   NA  Current AVH:  No  Plan for Discharge/Comments:  Pt will be discharged on Friday. He states his mother will probably be able to pick him up and he plans to call her today.   Transportation Means: Mother  Supports: Family   Mario [name],Mario [name]

## 2014-03-28 NOTE — Plan of Care (Signed)
Problem: Aggression Towards others,Towards Self, and or Destruction Goal: STG-Patient will comply with prescribed medication regimen (Patient will comply with prescribed medication regimen)  Outcome: Progressing Patient is taking medications today as prescribed without issues.

## 2014-03-28 NOTE — Progress Notes (Signed)
Patient ID: Mario [name], male   DOB: 04-08-1991, 23 y.o.   MRN: [id number] D: Client in bed, but alert, pleasant, give me a half smile when asked if he was tired of [place], seems more at ease, denies AVH, but still appears to be preoccupied. Family in early this evening. A: Writer reviewed and administered medications without incidence. Staff will monitor q23min for safety. R: Client is safe on the unit, did not attend group.

## 2014-03-29 MED ORDER — HALOPERIDOL 5 MG PO TABS
10.0000 mg | ORAL_TABLET | Freq: Every day | ORAL | Status: DC
  Administered 2014-03-30: 10 mg via ORAL
  Filled 2014-03-29 (×2): qty 2
  Filled 2014-03-29: qty 28

## 2014-03-29 MED ORDER — BENZTROPINE MESYLATE 1 MG/ML IJ SOLN
0.5000 mg | Freq: Once | INTRAMUSCULAR | Status: AC
  Administered 2014-03-29: 0.5 mg via INTRAMUSCULAR
  Filled 2014-03-29: qty 0.5

## 2014-03-29 MED ORDER — HALOPERIDOL 5 MG PO TABS
20.0000 mg | ORAL_TABLET | Freq: Every evening | ORAL | Status: DC
  Administered 2014-03-29: 20 mg via ORAL
  Filled 2014-03-29 (×2): qty 4
  Filled 2014-03-29: qty 56

## 2014-03-29 MED ORDER — HALOPERIDOL DECANOATE 100 MG/ML IM SOLN
100.0000 mg | Freq: Once | INTRAMUSCULAR | Status: AC
  Administered 2014-03-29: 100 mg via INTRAMUSCULAR
  Filled 2014-03-29: qty 1

## 2014-03-29 MED ORDER — BENZTROPINE MESYLATE 0.5 MG PO TABS
0.5000 mg | ORAL_TABLET | Freq: Two times a day (BID) | ORAL | Status: DC
  Administered 2014-03-29 – 2014-03-30 (×2): 0.5 mg via ORAL
  Filled 2014-03-29: qty 1
  Filled 2014-03-29: qty 28
  Filled 2014-03-29 (×3): qty 1
  Filled 2014-03-29: qty 28

## 2014-03-29 NOTE — Progress Notes (Signed)
Patient ID: Mario [name], male   DOB: 06/28/1991, 23 y.o.   MRN: [id number] [facility] Group Notes:  (Nursing/MHT/Case Management/Adjunct)  Date:  03/29/2014  Time:  10:27 AM  Type of Therapy:  Nurse Education  Participation Level:  Did Not Attend  Participation Quality:  did not attend  Affect:  did not attend  Cognitive:  did not attend  Insight:  None  Engagement in Group:  None  Modes of Intervention:  did not attend  Summary of Progress/Problems: The purpose of this group is to speak about leisure and lifestyle change. Writer speaks about how nutrition, activity, and self care. The patient did not attend, although encouraged.

## 2014-03-29 NOTE — Progress Notes (Signed)
Patient ID: Mario [name], male   DOB: Mar 14, 1991, 23 y.o.   MRN: [id number] Patient ID: Mario [name], male   DOB: 1991/01/29, 23 y.o.   MRN: [id number] [facility] MD Progress Note  03/29/2014 11:53 AM Mario [name]  MRN:  [id number]  Subjective: Mario [name] reports ," I am feeling ok".  Objective: Pt seen and chart reviewed. Pt today was seen in treatment room today. Patient is more verbal and interactive. Patient continues to improve . Patient continues to need encouragement with taking a shower and changing clothes, but he is improving. Patient is cooperative and redirectable . Mario [name] continues to deny SI/HI/AH/VH. He continues to be guarded but is improving.  Per staff ,patient continues to  attend groups and is more interactive in groups (per CSW).   Patient denies any pain with regards to his wound and reports no changes. Per Hospitalist consult -wound is healing and very superficial.      Per  Mario [name] who is Arts administrator - who reports patient has been allowed to stay until Tuesday ,29 th September, per court.       Diagnosis:   DSM5: Primary psychiatric diagnosis:  Schizophrenia ,Multiple episodes ,currently in acute episodes   Secondary Psychiatric diagnosis;  Cannabis use disorder   Non -psychiatric diagnosis: Wound on leg  Total Time spent with patient: 30 minutes  ADL's:  Impaired improving  Sleep: Good  Appetite:  Good  Suicidal Ideation:  Denies ,but was wandering streets and is a danger to self at this time.  Homicidal Ideation:  Denies  Psychiatric Specialty Exam: Physical Exam  Constitutional: He is oriented to person, place, and time. He appears well-developed and well-nourished.  HENT:  Head: Normocephalic and atraumatic.  Neck: Normal range of motion.  Neurological: He is alert and oriented to person, place, and time.    Review of Systems  Constitutional: Negative.   HENT: Negative.   Eyes: Negative.   Respiratory: Negative.    Cardiovascular: Negative.   Gastrointestinal: Negative.   Genitourinary: Negative.   Musculoskeletal: Negative.   Skin: Negative.        Has a lesion on his upper right anterior thigh-lesions is superficial ,healing  Neurological: Negative.   Endo/Heme/Allergies: Negative.   Psychiatric/Behavioral: Positive for substance abuse. Negative for suicidal ideas (has been wandering streets before coming in to hospital). The patient is nervous/anxious.     Blood pressure 131/76, pulse 92, temperature 98.7 F (37.1 C), temperature source Oral, resp. rate 20, height  (1.676 m), weight 68.947 kg (152 lb), SpO2 99.00%.Body mass index is 24.55 kg/(m^2).  General Appearance: Guarded,started taking showers and changing clothes,needs a lot of encouragement to do so. Patient continues to be guarded but is improving.  Eye Contact::  Good  Speech:  Slow improved  Volume:  Normal  Mood:  Anxious  Affect:  Appropriate smiles and is more pleasant  Thought Process:  Coherent  Orientation:  Other:  to person and place  Thought Content:  Delusions and Paranoid Ideation improving  Suicidal Thoughts:  No ,but was wandering the streets ,and laying in his own urine before coming in to hospital  Homicidal Thoughts:  No  Memory:  Immediate;   Fair Recent;   Fair Remote;   Fair  Judgement:  Impaired  Insight:  Lacking  Psychomotor Activity:  Decreased and improved  Concentration:  Fair  Recall:  [employer] of Knowledge:Fair  Language: Good  Akathisia:  No    AIMS (if indicated):  0  Assets:  Social Support  Sleep:  Number of Hours: 5.75   Musculoskeletal: Strength & Muscle Tone: within normal limits Gait & Station: normal Patient leans: N/A  Current Medications: Current Facility-Administered Medications  Medication Dose Route Frequency Provider Last Rate Last Dose  . ARIPiprazole (ABILIFY) tablet 10 mg  10 mg Oral BID Jomarie Longs, MD   10 mg at 03/29/14 0904  . bacitracin ointment 1  application  1 application Topical BID Lise Auer Piepenbrink, PA-C   1 application at 03/18/14 0802  . benztropine (COGENTIN) tablet 0.5 mg  0.5 mg Oral BID Jomarie Longs, MD      . benztropine mesylate (COGENTIN) injection 0.5 mg  0.5 mg Intramuscular Once Stepheni Cameron, MD      . buPROPion (WELLBUTRIN XL) 24 hr tablet 150 mg  150 mg Oral Daily Jomarie Longs, MD   150 mg at 03/29/14 0903  . cephALEXin (KEFLEX) capsule 500 mg  500 mg Oral Q12H Sanjuana Kava, NP   500 mg at 03/29/14 0903  . diphenhydrAMINE (BENADRYL) injection 50 mg  50 mg Intramuscular Once Caven Perine, MD      . diphenhydrAMINE (BENADRYL) injection 50 mg  50 mg Intramuscular Q8H PRN Sanjuana Kava, NP      . Melene Muller ON 03/30/2014] haloperidol (HALDOL) tablet 10 mg  10 mg Oral Daily Kalani Baray, MD      . haloperidol (HALDOL) tablet 20 mg  20 mg Oral QPM Laree Garron, MD      . haloperidol decanoate (HALDOL DECANOATE) 100 MG/ML injection 100 mg  100 mg Intramuscular Once Derreon Consalvo, MD      . haloperidol lactate (HALDOL) injection 10 mg  10 mg Intramuscular Q8H PRN Sanjuana Kava, NP      . lamoTRIgine (LAMICTAL) tablet 100 mg  100 mg Oral BID Jomarie Longs, MD   100 mg at 03/29/14 0903  . LORazepam (ATIVAN) injection 2 mg  2 mg Intramuscular Once Spencer E Simon, PA-C      . magnesium hydroxide (MILK OF MAGNESIA) suspension 30 mL  30 mL Oral Daily PRN Shuvon Rankin, NP      . OLANZapine zydis (ZYPREXA) disintegrating tablet 5 mg  5 mg Oral Once Jomarie Longs, MD      . traZODone (DESYREL) tablet 50 mg  50 mg Oral QHS PRN Jomarie Longs, MD        Lab Results:  Results for orders placed during the hospital encounter of 03/14/14 (from the past 48 hour(s))  CBC WITH DIFFERENTIAL     Status: Abnormal   Collection Time    03/28/14  6:38 AM      Result Value Ref Range   WBC 6.6  4.0 - 10.5 K/uL   RBC 4.66  4.22 - 5.81 MIL/uL   Hemoglobin 12.8 (*) 13.0 - 17.0 g/dL   HCT 86.5  78.4 - 69.6 %   MCV 86.5  78.0 - 100.0  fL   MCH 27.5  26.0 - 34.0 pg   MCHC 31.8  30.0 - 36.0 g/dL   RDW 29.5  28.4 - 13.2 %   Platelets 263  150 - 400 K/uL   Neutrophils Relative % 70  43 - 77 %   Neutro Abs 4.7  1.7 - 7.7 K/uL   Lymphocytes Relative 20  12 - 46 %   Lymphs Abs 1.3  0.7 - 4.0 K/uL   Monocytes Relative 8  3 - 12 %   Monocytes Absolute 0.5  0.1 - 1.0 K/uL   Eosinophils Relative 2  0 - 5 %   Eosinophils Absolute 0.1  0.0 - 0.7 K/uL   Basophils Relative 0  0 - 1 %   Basophils Absolute 0.0  0.0 - 0.1 K/uL   Comment: Performed at [facility]  HEMOGLOBIN A1C     Status: Abnormal   Collection Time    03/28/14  6:38 AM      Result Value Ref Range   Hemoglobin A1C 6.0 (*) <5.7 %   Comment: (NOTE)                                                                               According to the ADA Clinical Practice Recommendations for 2011, when     HbA1c is used as a screening test:      >=6.5%   Diagnostic of Diabetes Mellitus               (if abnormal result is confirmed)     5.7-6.4%   Increased risk of developing Diabetes Mellitus     References:Diagnosis and Classification of Diabetes Mellitus,Diabetes     Care,2011,34(Suppl 1):S62-S69 and Standards of Medical Care in             Diabetes - 2011,Diabetes Care,2011,34 (Suppl 1):S11-S61.   Mean Plasma Glucose 126 (*) <117 mg/dL   Comment: Performed at [facility]  LIPID PANEL     Status: Abnormal   Collection Time    03/28/14  6:38 AM      Result Value Ref Range   Cholesterol 207 (*) 0 - 200 mg/dL   Triglycerides 63  <045 mg/dL   HDL 71  >40 mg/dL   Total CHOL/HDL Ratio 2.9     VLDL 13  0 - 40 mg/dL   LDL Cholesterol 981 (*) 0 - 99 mg/dL   Comment:            Total Cholesterol/HDL:CHD Risk     Coronary Heart Disease Risk Table                         Men   Women      1/2 Average Risk   3.4   3.3      Average Risk       5.0   4.4      2 X Average Risk   9.6   7.1      3 X Average Risk  23.4   11.0                Use  the calculated Patient Ratio     above and the CHD Risk Table     to determine the patient's CHD Risk.                ATP III CLASSIFICATION (LDL):      <100     mg/dL   Optimal      191-478  mg/dL   Near or Above                        Optimal  130-159  mg/dL   Borderline      782-956  mg/dL   High      >213     mg/dL   Very High     Performed at [facility]  PROLACTIN     Status: Abnormal   Collection Time    03/28/14  6:38 AM      Result Value Ref Range   Prolactin 23.5 (*) 2.1 - 17.1 ng/mL   Comment: (NOTE)         Reference Ranges:                     Male:                       2.1 -  17.1 ng/ml                     Male:   Pregnant          9.7 - 208.5 ng/mL                               Non Pregnant      2.8 -  29.2 ng/mL                               Post Menopausal   1.8 -  20.3 ng/mL                           Performed at [facility]    Physical Findings: AIMS: Facial and Oral Movements Muscles of Facial Expression: None, normal Lips and Perioral Area: None, normal Jaw: None, normal Tongue: None, normal,Extremity Movements Upper (arms, wrists, hands, fingers): None, normal Lower (legs, knees, ankles, toes): None, normal, Trunk Movements Neck, shoulders, hips: None, normal, Overall Severity Severity of abnormal movements (highest score from questions above): None, normal Incapacitation due to abnormal movements: None, normal Patient's awareness of abnormal movements (rate only patient's report): No Awareness, Dental Status Current problems with teeth and/or dentures?: No Does patient usually wear dentures?: No  CIWA:    COWS:     Treatment Plan Summary: Daily contact with patient to assess and evaluate symptoms and progress in treatment Medication management  Plan: Continue crisis management/mood stabilization treatment .  Continue Haldol  30 mg po daily for mood control. Will continue Cogentin for EPS.Will give Haldol Decanoate IM 100  mg today along with cogentin IM for EPS. Continue Abilify  10 mg po bid  for mood control. Continue  Lamictal 100 mg po bid for mood stabilization,  Wellbutrin XL 150 mg for motivation as well as energy, Trazodone 50 mg po qhs prn for sleep.  Patient seen by hospitalist, (see above) - per consult - wound healing ,no antibiotics necessary at this point. CSW will work on disposition. Per discussion with mother she will assist patient with medications and appointments once discharged. Referral to act team made ,patient will also have a case worker on discharge. Patient to participate in milieu/attend groups.     Medical Decision Making  Problem Points:  Established problem, stable/improving (1) and Review of psycho-social stressors (1) Data Points:  Order Aims Assessment (2) Review of medication regiment & side effects (2) Review of new medications or change in dosage (2)  I certify that inpatient services  furnished can reasonably be expected to improve the patient's condition.   Mario Olander,MD  03/29/2014, 11:53 AM

## 2014-03-29 NOTE — Tx Team (Signed)
Interdisciplinary Treatment Plan Update (Adult)   Date: 03/29/2014   Time Reviewed: 10:51 AM  Progress in Treatment:  Attending groups: Yes  Participating in groups: Minimally Taking medication as prescribed: Yes  Tolerating medication: Yes  Family/Significant othe contact made: SPE completed and collateral info received from pt's mother.  Patient understands diagnosis: Partially, AEB seeking treatment for medication stabilization. Pt demonstrates limited insight at this time, minimally engaged with staff who are attempting to ask him questions. Discussing patient identified problems/goals with staff: Yes  Medical problems stabilized or resolved: Yes  Denies suicidal/homicidal ideation: Yes during admission/self report  Patient has not harmed self or Others: Yes  New problem(s) identified: Discharge Plan or Barriers: Pt will return home to his mother and go to [facility] for med management/assessment for therapy services. ACT referral sent to [facility]-pt will be placed on waiting list. This agency is able to use IPRS funding. CSW also contacted [facility] and referred pt for Care Coordination.   Additional comments:       Reason for Continuation of Hospitalization: Mood stabilization Med management  Estimated length of stay: 3 days (tentative d/c Monday) For review of initial/current patient goals, please see plan of care.  Attendees:  Patient:    Family:    Physician: Dr. Elna Breslow MD 03/29/2014 10:51 AM   Nursing: Sue Lush RN  03/29/2014 10:51 AM   Clinical Social Worker Franko Hilliker Smart, LCSWA  03/29/2014 10:51 AM   Other: Daryel Gerald, LCSW 03/29/2014 10:51 AM   Other: [name] Dakin RN  03/29/2014 10:51 AM   Other:  Charleston Ropes, CSW Intern  03/29/2014 10:51 AM   Other:  03/29/2014 10:51 AM   Scribe for Treatment Team:  Herbert Seta Smart LCSWA 03/29/2014 10:51 AM

## 2014-03-29 NOTE — Progress Notes (Signed)
Patient ID: Mario [name], male   DOB: [birth year], 76LAVONTAE [id number]  D: Pt. Denies SI/HI and A/V Hallucinations to this Clinical research associate. Patient is more talkative today but continues to neglect his ADL's. Patient is encouraged by several staff members to practice self care and bathe. Patient simply placed new scrubs over soiled clothes. Writer and staff continue to encourage patient to do ADL's.  Patient does not report any pain or discomfort at this time.   A: Support and encouragement provided to the patient. Scheduled medications administered to patient. Patient received IM injection of Haldol Decanoate this morning. Patient accepted IM injection with no sign or symptoms of distress. Patient has not reported and nothing has been observed to alert writer of adverse reaction but will continue to monitor.  R: Patient is minimal with Clinical research associate and continues to be isolative at times. However, patient is being seen more in the milieu and is attending some groups. Q15 minute checks are maintained for safety.

## 2014-03-30 MED ORDER — HALOPERIDOL DECANOATE 100 MG/ML IM SOLN: 100.0000 mg | INTRAMUSCULAR | Status: DC

## 2014-03-30 MED ORDER — HALOPERIDOL 20 MG PO TABS: 20.0000 mg | ORAL_TABLET | Freq: Every evening | ORAL | Status: DC

## 2014-03-30 MED ORDER — BUPROPION HCL ER (XL) 150 MG PO TB24: 150.0000 mg | ORAL_TABLET | Freq: Every day | ORAL | Status: DC

## 2014-03-30 MED ORDER — HYDROXYZINE HCL 25 MG PO TABS: 25.0000 mg | ORAL_TABLET | Freq: Every evening | ORAL | Status: DC | PRN

## 2014-03-30 MED ORDER — DIPHENHYDRAMINE HCL 25 MG PO CAPS
25.0000 mg | ORAL_CAPSULE | Freq: Four times a day (QID) | ORAL | Status: DC | PRN
  Filled 2014-03-30 (×2): qty 40

## 2014-03-30 MED ORDER — BENZTROPINE MESYLATE 1 MG/ML IJ SOLN: 0.5000 mg | INTRAMUSCULAR | Status: DC

## 2014-03-30 MED ORDER — LAMOTRIGINE 100 MG PO TABS: 100.0000 mg | ORAL_TABLET | Freq: Two times a day (BID) | ORAL | Status: DC

## 2014-03-30 MED ORDER — TRAZODONE HCL 50 MG PO TABS: 50.0000 mg | ORAL_TABLET | Freq: Every evening | ORAL | Status: DC | PRN

## 2014-03-30 MED ORDER — ARIPIPRAZOLE 10 MG PO TABS: 10.0000 mg | ORAL_TABLET | Freq: Two times a day (BID) | ORAL | Status: DC

## 2014-03-30 MED ORDER — CEPHALEXIN 500 MG PO CAPS: 500.0000 mg | ORAL_CAPSULE | Freq: Two times a day (BID) | ORAL | Status: DC

## 2014-03-30 MED ORDER — HYDROXYZINE HCL 25 MG PO TABS
25.0000 mg | ORAL_TABLET | Freq: Every evening | ORAL | Status: DC | PRN
  Filled 2014-03-30 (×2): qty 14

## 2014-03-30 MED ORDER — HALOPERIDOL 10 MG PO TABS: 10.0000 mg | ORAL_TABLET | Freq: Every day | ORAL | Status: DC

## 2014-03-30 MED ORDER — BENZTROPINE MESYLATE 0.5 MG PO TABS: 0.5000 mg | ORAL_TABLET | Freq: Two times a day (BID) | ORAL | Status: DC

## 2014-03-30 NOTE — Progress Notes (Signed)
Patient ID: Mario [name], male   DOB: October 13, 1990, 23 y.o.   MRN: [id number] D: Client visible in hall, questioning medications "what you giving me, I already had my medication" A: Writer reviewed medications and encouraged client to take  Keflex. Staff will monitor q53min for safety. R: Client is safe on the unit and took medications. Client attended karaoke.

## 2014-03-30 NOTE — [facility] Suicide Risk Assessment (Addendum)
Demographic Factors:  Male and Unemployed  Total Time spent with patient: 30 minutes  Psychiatric Specialty Exam: Physical Exam  Constitutional: He is oriented to person, place, and time. He appears well-developed and well-nourished.  HENT:  Head: Normocephalic and atraumatic.  Eyes: Conjunctivae are normal. Pupils are equal, round, and reactive to light.  Neck: Normal range of motion.  Cardiovascular: Normal rate.   Respiratory: Effort normal.  GI: Soft.  Musculoskeletal: Normal range of motion.  Neurological: He is alert and oriented to person, place, and time.  Skin: Skin is warm.  Psychiatric: He has a normal mood and affect. His behavior is normal. Thought content normal. He expresses impulsivity (improved).    Review of Systems  Constitutional: Negative.   HENT: Negative.   Eyes: Negative.   Respiratory: Negative.   Cardiovascular: Negative.   Gastrointestinal: Negative.   Genitourinary: Negative.   Musculoskeletal: Negative.   Skin: Negative.   Neurological: Negative.   Psychiatric/Behavioral: Positive for substance abuse (stable). Negative for suicidal ideas and hallucinations.    Blood pressure 113/59, pulse 71, temperature 98.4 F (36.9 C), temperature source Oral, resp. rate 18, height  (1.676 m), weight 68.947 kg (152 lb), SpO2 99.00%.Body mass index is 24.55 kg/(m^2).  General Appearance: Casual  Eye Contact::  Fair  Speech:  Clear and Coherent  Volume:  Normal  Mood:  Anxious and improving  Affect:  Appropriate  Thought Process:  Coherent  Orientation:  Full (Time, Place, and Person)  Thought Content:  WDL  Suicidal Thoughts:  No  Homicidal Thoughts:  No  Memory:  Immediate;   Fair Recent;   Fair Remote;   Fair  Judgement:  Fair  Insight:  Fair  Psychomotor Activity:  Normal  Concentration:  Fair  Recall:  [employer] of Knowledge:Fair  Language: Good  Akathisia:  No    AIMS (if indicated):   0  Assets:  Communication Skills Desire for  Improvement Housing Social Support  Sleep:  Number of Hours: 6.25    Musculoskeletal: Strength & Muscle Tone: within normal limits Gait & Station: normal Patient leans: N/A   Mental Status Per Nursing Assessment::   On Admission:     Current Mental Status by Physician: Patient is more organized,denies SI/HI/AH/VH, appears to be more pleasant and cheerful  Loss Factors: Decrease in vocational status and Financial problems/change in socioeconomic status  Historical Factors: Impulsivity  Risk Reduction Factors:   Positive social support and Positive therapeutic relationship  Continued Clinical Symptoms:  Previous Psychiatric Diagnoses and Treatments  Cognitive Features That Contribute To Risk:  Polarized thinking    Suicide Risk:  Minimal: No identifiable suicidal ideation.  Patients presenting with no risk factors but with morbid ruminations; may be classified as minimal risk based on the severity of the depressive symptoms  Discharge Diagnoses:  DSM5:  Primary psychiatric diagnosis:  Schizophrenia ,Multiple episodes ,currently in (acute episodes (resolved)  Secondary Psychiatric diagnosis;  Cannabis use disorder   Non -psychiatric diagnosis:  Wound on right anterior thigh (healing)   Past Medical History  Diagnosis Date  . Bipolar 1 disorder   . Schizophrenia     Plan Of Care/Follow-up recommendations:  Activity:  No restrictions  Is patient on multiple antipsychotic therapies at discharge:  No   Has Patient had three or more failed trials of antipsychotic monotherapy by history:  No  Recommended Plan for Multiple Antipsychotic Therapies: Additional reason(s) for multiple antispychotic treatment:  patient is on multiple antipsychotic ,patient's condition could not be  stabilized on antipsychotic monotherapy.Could consider tapering of Abilify and continue Haldol decanoate ,if patient continues to be stable.    Saylah Ketner MD 03/30/2014, 10:42 AM

## 2014-03-30 NOTE — Progress Notes (Signed)
[facility]  Will you be returning to the same living situation after discharge: Yes,  home with mom At discharge, do you have transportation home?:Yes,  pt's mother Do you have the ability to pay for your medications:Yes,  mental health  Release of information consent forms completed and submitted to Medical Records by CSW.  Patient to Follow up at: Follow-up Information   Follow up with [facility]. (Walk in between 8am-9am Monday through Friday for hospital follow-up/assessment for therapy services. )    Contact information:   [address] [place] [address] Phone: [phone] Fax: [phone]      Follow up with [facility]. (Referral made for ACT services. This agency will contact you after discharge to verify acceptance and update you regarding waitlist for services. )    Contact information:   [address] [place], [place] [address] Phone: [phone] Fax: [phone]      Follow up with [facility] On 04/02/2014. (Call Monday at 9:00AM to schedule hospital follow-up with this provider for Wednesday. )    Contact information:   [address] [place], [place] [address] Phone: [phone] Fax: [phone]      Patient denies SI/HI:   Yes,  during group/self report.    Safety Planning and Suicide Prevention discussed:  Yes,  SPE completed with pt's mother. SPI pamphlet provided to pt and he was encouraged to share information with support network, ask questions, and talk about any concerns relating to SPE.  Smart, Lelend Heinecke LCSWA 03/30/2014, 10:35 AM

## 2014-03-30 NOTE — Discharge Summary (Signed)
Physician Discharge Summary Note  Patient:  Mario [name] is an 23 y.o., male MRN:  [id number] DOB:  October 30, 1990 Patient phone:  [phone] (home)  Patient address:   [address] [place] [address],  Total Time spent with patient: 20 minutes  Date of Admission:  03/14/2014 Date of Discharge: 03/30/14  Reason for Admission:  Mood stabilization   Discharge Diagnoses: Active Problems:   Psychotic disorder   Multiple wounds of skin  Psychiatric Specialty Exam: Physical Exam  Review of Systems  Constitutional: Negative.   HENT: Negative.   Eyes: Negative.   Respiratory: Negative.   Cardiovascular: Negative.   Gastrointestinal: Negative.   Genitourinary: Negative.   Musculoskeletal: Negative.   Skin: Negative.   Neurological: Negative.   Endo/Heme/Allergies: Negative.   Psychiatric/Behavioral: Positive for depression (Stable ) and hallucinations (Stable ).    Blood pressure 113/59, pulse 71, temperature 98.4 F (36.9 C), temperature source Oral, resp. rate 18, height  (1.676 m), weight 68.947 kg (152 lb), SpO2 99.00%.Body mass index is 24.55 kg/(m^2).  See Physician SRA                                                  Past Psychiatric History: See H&P Diagnosis:  Hospitalizations:  Outpatient Care:  Substance Abuse Care:  Self-Mutilation:  Suicidal Attempts:  Violent Behaviors:   Musculoskeletal: Strength & Muscle Tone: within normal limits Gait & Station: normal Patient leans: N/A  DSM5: Primary psychiatric diagnosis:  Schizophrenia ,Multiple episodes ,currently in (acute episodes (resolved)   Secondary Psychiatric diagnosis;  Cannabis use disorder   Non -psychiatric diagnosis:  Wound on right anterior thigh (healing)  Level of Care:  OP  Hospital Course:  Mario [name] is a 23 year old AAM who lives with his mother in [place]. Patient has a past history of schizoaffective disorder ,has been noncompliant with  medications and has been decompensating since the pasts several days. Patient was IVC ed by his mother for the same reason . Patient is a limited historian and reported that he does not know why he is here. He has poor insight in to his illness and is unreliable. Patient on evaluation appears to have psychomotor retardation , is paranoid ,and appears to talk in monosyllables during most part. Patient has a burn on his leg ,but patient was not cooperative with showing it to staff.  Patient was noticed to have facial swelling with shiny skin on his face and lip swelling. Patient was not able to reliably give information about his symptoms or if he is allergic to any medications. Hence patient to be transported to ED for evaluation and treatment after being given IM Benadryl.          Mario [name] was admitted to the adult 400 unit. He was evaluated and his symptoms were identified. Medication management was discussed and initiated.  During the first part of his admission tegretol was stopped due to the development of facial swelling.  Patient was started on Haldol orally to control his symptoms of psychosis. Eventually Abilify was added to help with treatment resistant psychosis and Wellbutrin Xl 150 mg daily to help with motivation. Prior to his discharge he was started on Haldol Decanoate 100 mg IM on 03/29/14 to help with medication compliance and continued stabilization of symptoms. The medication Lamictal 100 mg BID was  also added to help improve his mood stability. He was oriented to the unit and encouraged to participate in unit programming. Medical problems were identified and treated appropriately. The patient was noted to have a right thigh wound for which an Internal Medicine consult was obtained. At times the patient was not compliant in allowing the staff to evaluate his wound. Patient received a course of Keflex during his hospitalization for treatment of skin infection.Patient later on was examined  by hospitalist ,who noted that his wounds were healing and did not need further treatment. However Keflex was continued to complete the course.  Home medication was restarted as needed.        The patient was evaluated each day by a clinical provider to ascertain the patient's response to treatment. During the first part of admission nursing staff reported that the patient was disheveled, isolating in his room, was delusional, and remained paranoid. His medications were adjusted several times as mentioned above to target his psychotic symptoms. The patient made slow but steady progress in his treatment. Improvement was noted by the patient's report of decreasing symptoms, improved sleep and appetite, affect, medication tolerance, behavior, and participation in unit programming.  He was asked each day to complete a self inventory noting mood, mental status, pain, new symptoms, anxiety and concerns.         He responded well to medication and being in a therapeutic and supportive environment. Positive and appropriate behavior was noted and the patient was motivated for recovery.  The patient worked closely with the treatment team and case manager to develop a discharge plan with appropriate goals. Coping skills, problem solving as well as relaxation therapies were also part of the unit programming.         By the day of discharge he was in much improved condition than upon admission.  Symptoms were reported as significantly decreased or resolved completely. The patient denied SI/HI and voiced no AVH. He was motivated to continue taking medication with a goal of continued improvement in mental health.  Mario [name] was discharged home with a plan to follow up as noted below. The patient received medication samples and prescriptions at time of discharge.   Consults:  psychiatry and Internal Medicine   Significant Diagnostic Studies:  Chemistry, Lipid profile, CBC, Prolactin level, Hemoglobin A1C  Discharge  Vitals:   Blood pressure 113/59, pulse 71, temperature 98.4 F (36.9 C), temperature source Oral, resp. rate 18, height  (1.676 m), weight 68.947 kg (152 lb), SpO2 99.00%. Body mass index is 24.55 kg/(m^2). Lab Results:   Results for orders placed during the hospital encounter of 03/14/14 (from the past 72 hour(s))  CBC WITH DIFFERENTIAL     Status: Abnormal   Collection Time    03/28/14  6:38 AM      Result Value Ref Range   WBC 6.6  4.0 - 10.5 K/uL   RBC 4.66  4.22 - 5.81 MIL/uL   Hemoglobin 12.8 (*) 13.0 - 17.0 g/dL   HCT 91.4  78.2 - 95.6 %   MCV 86.5  78.0 - 100.0 fL   MCH 27.5  26.0 - 34.0 pg   MCHC 31.8  30.0 - 36.0 g/dL   RDW 21.3  08.6 - 57.8 %   Platelets 263  150 - 400 K/uL   Neutrophils Relative % 70  43 - 77 %   Neutro Abs 4.7  1.7 - 7.7 K/uL   Lymphocytes Relative 20  12 - 46 %  Lymphs Abs 1.3  0.7 - 4.0 K/uL   Monocytes Relative 8  3 - 12 %   Monocytes Absolute 0.5  0.1 - 1.0 K/uL   Eosinophils Relative 2  0 - 5 %   Eosinophils Absolute 0.1  0.0 - 0.7 K/uL   Basophils Relative 0  0 - 1 %   Basophils Absolute 0.0  0.0 - 0.1 K/uL   Comment: Performed at [facility]  HEMOGLOBIN A1C     Status: Abnormal   Collection Time    03/28/14  6:38 AM      Result Value Ref Range   Hemoglobin A1C 6.0 (*) <5.7 %   Comment: (NOTE)                                                                               According to the ADA Clinical Practice Recommendations for 2011, when     HbA1c is used as a screening test:      >=6.5%   Diagnostic of Diabetes Mellitus               (if abnormal result is confirmed)     5.7-6.4%   Increased risk of developing Diabetes Mellitus     References:Diagnosis and Classification of Diabetes Mellitus,Diabetes     Care,2011,34(Suppl 1):S62-S69 and Standards of Medical Care in             Diabetes - 2011,Diabetes Care,2011,34 (Suppl 1):S11-S61.   Mean Plasma Glucose 126 (*) <117 mg/dL   Comment: Performed at [facility]  LIPID PANEL     Status: Abnormal   Collection Time    03/28/14  6:38 AM      Result Value Ref Range   Cholesterol 207 (*) 0 - 200 mg/dL   Triglycerides 63  <161 mg/dL   HDL 71  >09 mg/dL   Total CHOL/HDL Ratio 2.9     VLDL 13  0 - 40 mg/dL   LDL Cholesterol 604 (*) 0 - 99 mg/dL   Comment:            Total Cholesterol/HDL:CHD Risk     Coronary Heart Disease Risk Table                         Men   Women      1/2 Average Risk   3.4   3.3      Average Risk       5.0   4.4      2 X Average Risk   9.6   7.1      3 X Average Risk  23.4   11.0                Use the calculated Patient Ratio     above and the CHD Risk Table     to determine the patient's CHD Risk.                ATP III CLASSIFICATION (LDL):      <100     mg/dL   Optimal      540-981  mg/dL   Near or Above  Optimal      130-159  mg/dL   Borderline      161-096  mg/dL   High      >045     mg/dL   Very High     Performed at [facility]  PROLACTIN     Status: Abnormal   Collection Time    03/28/14  6:38 AM      Result Value Ref Range   Prolactin 23.5 (*) 2.1 - 17.1 ng/mL   Comment: (NOTE)         Reference Ranges:                     Male:                       2.1 -  17.1 ng/ml                     Male:   Pregnant          9.7 - 208.5 ng/mL                               Non Pregnant      2.8 -  29.2 ng/mL                               Post Menopausal   1.8 -  20.3 ng/mL                           Performed at [facility]    Physical Findings: AIMS: Facial and Oral Movements Muscles of Facial Expression: None, normal Lips and Perioral Area: None, normal Jaw: None, normal Tongue: None, normal,Extremity Movements Upper (arms, wrists, hands, fingers): None, normal Lower (legs, knees, ankles, toes): None, normal, Trunk Movements Neck, shoulders, hips: None, normal, Overall Severity Severity of abnormal movements (highest score from questions above): None,  normal Incapacitation due to abnormal movements: None, normal Patient's awareness of abnormal movements (rate only patient's report): No Awareness, Dental Status Current problems with teeth and/or dentures?: No Does patient usually wear dentures?: No  CIWA:    COWS:     Psychiatric Specialty Exam: See Psychiatric Specialty Exam and Suicide Risk Assessment completed by Attending Physician prior to discharge.  Discharge destination:  Home  Is patient on multiple antipsychotic therapies at discharge:  Yes,   Do you recommend tapering to monotherapy for antipsychotics?  No   Has Patient had three or more failed trials of antipsychotic monotherapy by history:  Yes,   Antipsychotic medications that previously failed include:   1.  Risperdal ., 2.  Geodon. and 3.  Haldol .  Recommended Plan for Multiple Antipsychotic Therapies: Additional reason(s) for multiple antispychotic treatment:  Patient required two antipsychotics to stabilize his severe psychotic symptoms.      Medication List    STOP taking these medications       carbamazepine 200 MG tablet  Commonly known as:  TEGRETOL     fluPHENAZine 5 MG tablet  Commonly known as:  PROLIXIN      TAKE these medications     Indication   ARIPiprazole 10 MG tablet  Commonly known as:  ABILIFY  Take 1 tablet (10 mg total) by mouth 2 (two) times daily.   Indication:  Schizophrenia  benztropine 0.5 MG tablet  Commonly known as:  COGENTIN  Take 1 tablet (0.5 mg total) by mouth 2 (two) times daily.   Indication:  Extrapyramidal Reaction caused by Medications     buPROPion 150 MG 24 hr tablet  Commonly known as:  WELLBUTRIN XL  Take 1 tablet (150 mg total) by mouth daily.   Indication:  Major Depressive Disorder     cephALEXin 500 MG capsule  Commonly known as:  KEFLEX  Take 1 capsule (500 mg total) by mouth every 12 (twelve) hours. Please take one capsule every twelve hours until finished to treat skin infection. Samples from the  pharmacy were provided.   Indication:  Infection of the Skin and Skin Structures     diphenhydrAMINE 25 MG tablet  Commonly known as:  BENADRYL  Take 1 tablet (25 mg total) by mouth every 6 (six) hours as needed for itching or allergies (Rash).      haloperidol 10 MG tablet  Commonly known as:  HALDOL  Take 1 tablet (10 mg total) by mouth daily.   Indication:  Schizophrenia     haloperidol 20 MG tablet  Commonly known as:  HALDOL  Take 1 tablet (20 mg total) by mouth every evening.   Indication:  Schizophrenia     haloperidol decanoate 100 MG/ML injection  Commonly known as:  HALDOL DECANOATE  Inject 1 mL (100 mg total) into the muscle every 14 (fourteen) days. First dose received on 03/29/14. Next dose due on 04/13/14.  Start taking on:  04/13/2014   Indication:  Schizophrenia     hydrOXYzine 25 MG tablet  Commonly known as:  ATARAX/VISTARIL  Take 1 tablet (25 mg total) by mouth at bedtime as needed (sleep).   Indication:  Sedation, Tension     lamoTRIgine 100 MG tablet  Commonly known as:  LAMICTAL  Take 1 tablet (100 mg total) by mouth 2 (two) times daily.   Indication:  Mood control     traZODone 50 MG tablet  Commonly known as:  DESYREL  Take 1 tablet (50 mg total) by mouth at bedtime as needed for sleep.   Indication:  Trouble Sleeping           Follow-up Information   Follow up with [facility]. (Walk in between 8am-9am Monday through Friday for hospital follow-up/assessment for therapy services. )    Contact information:   [address] [place] [address] Phone: [phone] Fax: [phone]      Follow up with [facility]. (Referral made for ACT services. This agency will contact you after discharge to verify acceptance and update you regarding waitlist for services. )    Contact information:   DO NOT PRINT AVS YET PLEASE      Follow up with [facility] On 04/02/2014. (Call Monday at 9:00AM to schedule hospital follow-up with  this provider for Wednesday. )    Contact information:   [address] [place], [place] [address] Phone: [phone] Fax: [phone]      Follow-up recommendations:   Activity: No restrictions   Comments:   Take all your medications as prescribed by your mental healthcare provider.  Report any adverse effects and or reactions from your medicines to your outpatient provider promptly.  Patient is instructed and cautioned to not engage in alcohol and or illegal drug use while on prescription medicines.  In the event of worsening symptoms, patient is instructed to call the crisis hotline, 911 and or go to  the nearest ED for appropriate evaluation and treatment of symptoms.  Follow-up with your primary care provider for your other medical issues, concerns and or health care needs.   Total Discharge Time:  Greater than 30 minutes.  SignedFransisca Kaufmann NP-C 03/30/2014, 10:34 AM  Patient was seen face to face for psychiatric evaluation, suicide risk assessment and case discussed with treatment team and NP and made appropriate disposition plans. Reviewed the information documented and agree with the treatment plan.     Jomarie Longs ,MD Attending Psychiatrist  [facility]

## 2014-03-30 NOTE — Clinical Social Work Note (Signed)
CSW called pt's mother back. Left vm on cell.    [employer], LCSWA 03/30/2014 8:22 AM

## 2014-03-30 NOTE — Progress Notes (Signed)
Patient ID: Mario [name], male   DOB: 1990/12/22, 23 y.o.   MRN: [id number]  D: Patient excited and smiling all day about discharge. Patient improved from admission. No SI. A: Obtained all belongings, prescriptions, sample medications, and follow up appointments. R: Mother here to pick up.

## 2014-04-03 NOTE — Progress Notes (Signed)
Patient Discharge Instructions:  After Visit Summary (AVS):   Faxed to:  04/03/14 Discharge Summary Note:   Faxed to:  04/03/14 Psychiatric Admission Assessment Note:   Faxed to:  04/03/14 Suicide Risk Assessment - Discharge Assessment:   Faxed to:  04/03/14 Faxed/Sent to the Next Level Care provider:  04/03/14 Next Level Care Provider Has Access to the EMR, 04/03/14 [facility] via CHL/Epic access. Faxed to [facility] @ [phone] Faxed to [facility] @ [phone]  Jerelene ReddenSheena E Fanshawe, 04/03/2014, 3:51 PM

## 2014-06-03 ENCOUNTER — Encounter (HOSPITAL_COMMUNITY): Payer: Self-pay | Admitting: *Deleted

## 2014-06-03 ENCOUNTER — Emergency Department (HOSPITAL_COMMUNITY)
Admission: EM | Admit: 2014-06-03 | Discharge: 2014-06-07 | Disposition: A | Discharge status: Home / Self Care | Payer: PRIVATE HEALTH INSURANCE | Attending: Emergency Medicine | Admitting: Emergency Medicine

## 2014-06-03 DIAGNOSIS — F1210 Cannabis abuse, uncomplicated: Secondary | ICD-10-CM | POA: Insufficient documentation

## 2014-06-03 DIAGNOSIS — Z720 Tobacco use: Secondary | ICD-10-CM | POA: Insufficient documentation

## 2014-06-03 DIAGNOSIS — F319 Bipolar disorder, unspecified: Secondary | ICD-10-CM | POA: Insufficient documentation

## 2014-06-03 DIAGNOSIS — F911 Conduct disorder, childhood-onset type: Secondary | ICD-10-CM | POA: Insufficient documentation

## 2014-06-03 DIAGNOSIS — R4689 Other symptoms and signs involving appearance and behavior: Secondary | ICD-10-CM

## 2014-06-03 DIAGNOSIS — F209 Schizophrenia, unspecified: Secondary | ICD-10-CM

## 2014-06-03 DIAGNOSIS — Z79899 Other long term (current) drug therapy: Secondary | ICD-10-CM | POA: Insufficient documentation

## 2014-06-03 DIAGNOSIS — F29 Unspecified psychosis not due to a substance or known physiological condition: Secondary | ICD-10-CM | POA: Diagnosis present

## 2014-06-03 NOTE — ED Notes (Signed)
PT arrives to the ER via EMS; family called for complaints of violent behavior and aggressive with family; pt states that pt has not been taking medications in weeks; pt standing in Consultation room and not answering questions; pt just staring at the walls

## 2014-06-03 NOTE — ED Provider Notes (Signed)
CSN: [id number]     Arrival date & time 06/03/14  2305 History   First MD Initiated Contact with Patient 06/03/14 2312     This chart was scribed for non-physician practitioner, Antony MaduraKelly Jeanluc Wegman, PA-C working with Vida RollerBrian D Miller, MD by Arlan OrganAshley Leger, ED Scribe. [facility] and the patient's care was started at 6:00 AM.   Chief Complaint  Patient presents with  . Aggressive Behavior  . IVC    The history is provided by the patient. No language interpreter was used.   LEVEL 5 CAVEAT SECONDARY TO PSYCHIATRIC CONDITION  HPI Comments: Mario [name] brought in by EMS and under IVC paperwork is a 23 y.o. male with a PMHx of bipolar 1 disorder and schizophrenia who presents to the Emergency Department here for aggressive behavior. Per EMS family states he has been very aggressive and violent this evening. Pt has been non complaint with medications for last few weeks per IVC papers. Pt with known allergies to carbamazepine and prolixin. He refuses to answer any questions.  Past Medical History  Diagnosis Date  . Bipolar 1 disorder   . Schizophrenia    History reviewed. No pertinent past surgical history. No family history on file. History  Substance Use Topics  . Smoking status: Current Every Day Smoker -- 0.50 packs/day  . Smokeless tobacco: Not on file  . Alcohol Use: No     Comment: occasionally    Review of Systems  Unable to perform ROS: Psychiatric disorder  All other systems reviewed and are negative.   Allergies  Carbamazepine and Prolixin  Home Medications   Prior to Admission medications   Medication Sig Start Date End Date Taking? Authorizing Provider  ARIPiprazole (ABILIFY) 10 MG tablet Take 1 tablet (10 mg total) by mouth 2 (two) times daily. 03/30/14   Fransisca KaufmannLaura Davis, NP  benztropine (COGENTIN) 0.5 MG tablet Take 1 tablet (0.5 mg total) by mouth 2 (two) times daily. 03/30/14   Fransisca KaufmannLaura Davis, NP  buPROPion (WELLBUTRIN XL) 150 MG 24 hr tablet Take  1 tablet (150 mg total) by mouth daily. 03/30/14   Fransisca KaufmannLaura Davis, NP  cephALEXin (KEFLEX) 500 MG capsule Take 1 capsule (500 mg total) by mouth every 12 (twelve) hours. Please take one capsule every twelve hours until finished to treat skin infection. Samples from the pharmacy were provided. Patient not taking: Reported on 06/03/2014 03/30/14   Fransisca KaufmannLaura Davis, NP  diphenhydrAMINE (BENADRYL) 25 MG tablet Take 1 tablet (25 mg total) by mouth every 6 (six) hours as needed for itching or allergies (Rash). 03/15/14   Jennifer L Piepenbrink, PA-C  haloperidol (HALDOL) 10 MG tablet Take 1 tablet (10 mg total) by mouth daily. 03/30/14   Fransisca KaufmannLaura Davis, NP  haloperidol (HALDOL) 20 MG tablet Take 1 tablet (20 mg total) by mouth every evening. 03/30/14   Fransisca KaufmannLaura Davis, NP  haloperidol decanoate (HALDOL DECANOATE) 100 MG/ML injection Inject 1 mL (100 mg total) into the muscle every 14 (fourteen) days. First dose received on 03/29/14. Next dose due on 04/13/14. 04/13/14   Fransisca KaufmannLaura Davis, NP  hydrOXYzine (ATARAX/VISTARIL) 25 MG tablet Take 1 tablet (25 mg total) by mouth at bedtime as needed (sleep). 02/17/13   Fransisca KaufmannLaura Davis, NP  hydrOXYzine (ATARAX/VISTARIL) 25 MG tablet Take 1 tablet (25 mg total) by mouth at bedtime as needed (sleep). 03/30/14   Fransisca KaufmannLaura Davis, NP  lamoTRIgine (LAMICTAL) 100 MG tablet Take 1 tablet (100 mg total) by mouth 2 (two) times daily. 03/30/14  Fransisca KaufmannLaura Davis, NP  traZODone (DESYREL) 50 MG tablet Take 1 tablet (50 mg total) by mouth at bedtime as needed for sleep. 03/30/14   Fransisca KaufmannLaura Davis, NP   Triage Vitals: BP 146/91 mmHg  Pulse 84  Temp(Src) 98.4 F (36.9 C) (Oral)  Resp 15  SpO2 100%   Physical Exam  Constitutional: He is oriented to person, place, and time. He appears well-developed and well-nourished. No distress.  Nontoxic/nonseptic appearing. Patient does appear disheveled  HENT:  Head: Normocephalic and atraumatic.  Eyes: Conjunctivae and EOM are normal. No scleral icterus.  Neck: Normal range of  motion.  Pulmonary/Chest: Effort normal. No respiratory distress.  Respirations even and unlabored  Abdominal: He exhibits no distension.  Musculoskeletal: Normal range of motion.  Neurological: He is alert and oriented to person, place, and time. He exhibits normal muscle tone. Coordination normal.  Patient ambulatory with normal gait. Speech is goal oriented.  Skin: Skin is warm and dry. No rash noted. He is not diaphoretic. No erythema. No pallor.  Psychiatric: He is agitated and withdrawn. He is noncommunicative.  Nursing note and vitals reviewed.   ED Course  Procedures (including critical care time)  DIAGNOSTIC STUDIES: Oxygen Saturation is 100% on RA, Normal by my interpretation.    COORDINATION OF CARE: 6:00 AM-Discussed treatment plan with pt at bedside and pt agreed to plan.     Labs Review Labs Reviewed  COMPREHENSIVE METABOLIC PANEL - Abnormal; Notable for the following:    Potassium 3.6 (*)    Glucose, Bld 101 (*)    Total Protein 8.8 (*)    Anion gap 18 (*)    All other components within normal limits  SALICYLATE LEVEL - Abnormal; Notable for the following:    Salicylate Lvl <2.0 (*)    All other components within normal limits  ACETAMINOPHEN LEVEL  CBC  ETHANOL  URINE RAPID DRUG SCREEN (HOSP PERFORMED)    Imaging Review No results found.   EKG Interpretation None      MDM   Final diagnoses:  Aggression    23 year old male presents to the emergency department via [facility]. He has been IVC by family for worsening aggression. Patient appears agitated. He will not answer any questions about the events which led to his presentation in the ED. He will neither confirm nor deny SI/HI. He'll not verbalize whether he has any additional complaints. Labs reviewed which show anion gap of 18. Electrolytes and bicarb, however, are normal. Believe patient can safely be medically cleared.  he has been evaluated by TTS who recommend inpatient treatment. Patient currently  with placement pending.  I personally performed the services described in this documentation, which was scribed in my presence. The recorded information has been reviewed and is accurate.    Filed Vitals:   06/03/14 2324 06/04/14 0141  BP: 140/103 146/91  Pulse: 99 84  Temp: 98.4 F (36.9 C)   TempSrc: Oral   Resp: 15 15  SpO2: 100% 100%     Antony MaduraKelly Cachet Mccutchen, PA-C 06/04/14 0603  Vida RollerBrian D Miller, MD 06/04/14 [phone]

## 2014-06-04 LAB — COMPREHENSIVE METABOLIC PANEL
ALT: 12 U/L (ref 0–53)
ANION GAP: 18 — AB (ref 5–15)
AST: 24 U/L (ref 0–37)
Albumin: 4.7 g/dL (ref 3.5–5.2)
Alkaline Phosphatase: 62 U/L (ref 39–117)
BUN: 9 mg/dL (ref 6–23)
CALCIUM: 9.9 mg/dL (ref 8.4–10.5)
CO2: 22 mEq/L (ref 19–32)
Chloride: 100 mEq/L (ref 96–112)
Creatinine, Ser: 0.94 mg/dL (ref 0.50–1.35)
GFR calc Af Amer: >90 mL/min (ref >90)
Glucose, Bld: 101 mg/dL — ABNORMAL HIGH (ref 70–99)
Potassium: 3.6 mEq/L — ABNORMAL LOW (ref 3.7–5.3)
SODIUM: 140 meq/L (ref 137–147)
TOTAL PROTEIN: 8.8 g/dL — AB (ref 6.0–8.3)
Total Bilirubin: 0.4 mg/dL (ref 0.3–1.2)

## 2014-06-04 LAB — CBC
HCT: 44 % (ref 39.0–52.0)
HEMOGLOBIN: 14.2 g/dL (ref 13.0–17.0)
MCH: 27.6 pg (ref 26.0–34.0)
MCHC: 32.3 g/dL (ref 30.0–36.0)
MCV: 85.6 fL (ref 78.0–100.0)
Platelets: 230 10*3/uL (ref 150–400)
RBC: 5.14 MIL/uL (ref 4.22–5.81)
RDW: 12.6 % (ref 11.5–15.5)
WBC: 8.4 10*3/uL (ref 4.0–10.5)

## 2014-06-04 LAB — ACETAMINOPHEN LEVEL: Acetaminophen (Tylenol), Serum: <15 ug/mL (ref 10–30)

## 2014-06-04 LAB — ETHANOL

## 2014-06-04 LAB — SALICYLATE LEVEL

## 2014-06-04 MED ORDER — ALUM & MAG HYDROXIDE-SIMETH 200-200-20 MG/5ML PO SUSP: 30.0000 mL | ORAL | Status: DC | PRN

## 2014-06-04 MED ORDER — NICOTINE 21 MG/24HR TD PT24
21.0000 mg | MEDICATED_PATCH | Freq: Every day | TRANSDERMAL | Status: DC
Start: 2014-06-04 — End: 2014-06-07
  Filled 2014-06-04: qty 1

## 2014-06-04 MED ORDER — IBUPROFEN 200 MG PO TABS
600.0000 mg | ORAL_TABLET | Freq: Three times a day (TID) | ORAL | Status: DC | PRN
  Filled 2014-06-04: qty 3

## 2014-06-04 MED ORDER — RISPERIDONE 1 MG PO TABS
1.0000 mg | ORAL_TABLET | Freq: Two times a day (BID) | ORAL | Status: DC
  Administered 2014-06-04 – 2014-06-05 (×2): 1 mg via ORAL
  Filled 2014-06-04 (×2): qty 1

## 2014-06-04 MED ORDER — ZOLPIDEM TARTRATE 5 MG PO TABS: 5.0000 mg | ORAL_TABLET | Freq: Every evening | ORAL | Status: DC | PRN

## 2014-06-04 MED ORDER — LORAZEPAM 1 MG PO TABS
1.0000 mg | ORAL_TABLET | Freq: Three times a day (TID) | ORAL | Status: DC | PRN
  Filled 2014-06-04: qty 1

## 2014-06-04 MED ORDER — ACETAMINOPHEN 325 MG PO TABS: 650.0000 mg | ORAL_TABLET | ORAL | Status: DC | PRN

## 2014-06-04 MED ORDER — DIVALPROEX SODIUM 250 MG PO DR TAB
250.0000 mg | DELAYED_RELEASE_TABLET | Freq: Two times a day (BID) | ORAL | Status: DC
  Administered 2014-06-04 – 2014-06-07 (×6): 250 mg via ORAL
  Filled 2014-06-04 (×6): qty 1

## 2014-06-04 MED ORDER — BUPROPION HCL ER (XL) 150 MG PO TB24
150.0000 mg | ORAL_TABLET | Freq: Every day | ORAL | Status: DC
  Administered 2014-06-04 – 2014-06-07 (×4): 150 mg via ORAL
  Filled 2014-06-04 (×4): qty 1

## 2014-06-04 MED ORDER — ONDANSETRON HCL 4 MG PO TABS: 4.0000 mg | ORAL_TABLET | Freq: Three times a day (TID) | ORAL | Status: DC | PRN

## 2014-06-04 NOTE — ED Notes (Signed)
Patient has a flat affect on approach today. Currently denies any SI/HI. Denies any auditory or visual hallucinations. Isolative and not speaking much with undersigned. Walking around and stares off some into space at times. Staff will continue to monitor for any aggressive behavior.

## 2014-06-04 NOTE — Treatment Plan (Signed)
[facility] due to his need for long-term treatment at a state psychiatric facility as evidenced by [facility] requiring forced medications and seclusion/restraint.  [facility], but pt did not stay compliant on his meds and did not follow-up with aftercare.  While waiting on a state bed, an ACTT can be contacted to assist with keeping patient well when not in the hospital.

## 2014-06-04 NOTE — BH Assessment (Signed)
Assessment completed. Consulted Janann Augustori Burkett, NP who recommended inpatient treatment. TTS will contact other facilities for placement. [facility]. Antony MaduraKelly Humes, PA-C has been informed of the recommendation.

## 2014-06-04 NOTE — ED Notes (Signed)
Pt resting on bed at present, awake. Alert & responsive, no distress, will continue to monitor for safety.
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
## 2014-06-04 NOTE — ED Notes (Signed)
Patient under IVC, needs first opinion completed. Denies SI, HI, AVH. Denies feelings of anxiety and depression. Denies any stressors or change to sleep habits or appetite. Patient is disheveled, appears flat.  Encouragement offered. Snack given.  Q 15 safety checks in place.

## 2014-06-04 NOTE — ED Notes (Signed)
Bed: [facility] Expected date:  Expected time:  Means of arrival:  Comments: Hold for triage 4 

## 2014-06-05 DIAGNOSIS — F259 Schizoaffective disorder, unspecified: Secondary | ICD-10-CM

## 2014-06-05 MED ORDER — RISPERIDONE 2 MG PO TABS
3.0000 mg | ORAL_TABLET | Freq: Every day | ORAL | Status: DC
  Administered 2014-06-06: 3 mg via ORAL
  Filled 2014-06-05 (×2): qty 1

## 2014-06-05 NOTE — ED Notes (Signed)
Patient is flat, calm. Patient stays in bed with minimal interaction. Denies SI, HI, AVH. Denies anxiety and depression.   Encouragement offered.  Q 15 safety checks continue.

## 2014-06-05 NOTE — Consult Note (Signed)
[place] [facility] Consult   Reason for Consult:  Report of disorganized, threatening behaviors at ho/me  Referring Physician:  ED Physician Mario [name] is an 23 y.o. male. Total Time spent with patient: 30 minutes  Assessment: AXIS I:  Schizoaffective Disorder AXIS II:  Deferred AXIS III:   Past Medical History  Diagnosis Date  . Bipolar 1 disorder   . Schizophrenia    AXIS IV:  economic problems and occupational problems AXIS V:  31-40 impairment in reality testing  Plan:  Recommend psychiatric Inpatient admission when medically cleared.  Subjective:   Mario [name] is a 23 y.o. male patient admitted with  Reports from family he has been disorganized, intermittently agitated and threatening.  HPI:  Patient is a 23 year old man, who has a psychiatric history. He has been diagnosed with Schizoaffective disorder and with Schizophrenia in the past. He has reportedly not been taking his prescribed psychiatric medications recently. Patient is a poor historian and denies any issues or symptoms. He answers most questions with monosyllables and denies any current symptoms. He states he does not know why he is in hospital. As per information in chart from his mother Mario [name], patient has been deteriorating since his recent psychiatric admission in September . He has been urinating on his bed and on himself, he has been refusing to eat or bathe. When encouraged to bathe , he has become angry and threatening towards his family, making threats to " kill them". Of note, patient denies any of this and states he has been " fine". He does state he has not been taking his medications because " I do not need them". At this time he seems blunted in affect, presenting with flat affect, poor verbalization. Denies depression, denies SI, denies having made any threats to family. Denies any drug or alcohol use. HPI Elements:   Exacerbation of chronic mental illness in the context of  medication non compliance   Past Psychiatric History: Had recent psychiatric admission for psychosis ( 9/15) . Has history of Schizophrenia, has also been diagnosed with Schizoaffective Disorder in the past. Patient denies any history of violence or suicidal attempts Past Medical History  Diagnosis Date  . Bipolar 1 disorder   . Schizophrenia     reports that he has been smoking.  He does not have any smokeless tobacco history on file. He reports that he uses illicit drugs (Marijuana). He reports that he does not drink alcohol. No family history on file. Family History Substance Abuse: No Family Supports: Yes, List: (Mother ) Living Arrangements: Parent Can pt return to current living arrangement?: Yes Abuse/Neglect [facility]) Physical Abuse: Denies Verbal Abuse: Denies Sexual Abuse: Denies Allergies:   Allergies  Allergen Reactions  . Carbamazepine     Face swelling  . Prolixin [Fluphenazine]      Objective: Blood pressure 124/68, pulse 68, temperature 98.5 F (36.9 C), temperature source Oral, resp. rate 16, SpO2 100 %.There is no weight on file to calculate BMI. Results for orders placed or performed during the hospital encounter of 06/03/14 (from the past 72 hour(s))  Acetaminophen level     Status: None   Collection Time: 06/04/14 12:08 AM  Result Value Ref Range   Acetaminophen (Tylenol), Serum <15.0 10 - 30 ug/mL    Comment:        THERAPEUTIC CONCENTRATIONS VARY SIGNIFICANTLY. A RANGE OF 10-30 ug/mL MAY BE AN EFFECTIVE CONCENTRATION FOR MANY PATIENTS. HOWEVER, SOME ARE BEST TREATED AT CONCENTRATIONS OUTSIDE THIS RANGE.  ACETAMINOPHEN CONCENTRATIONS >150 ug/mL AT 4 HOURS AFTER INGESTION AND >50 ug/mL AT 12 HOURS AFTER INGESTION ARE OFTEN ASSOCIATED WITH TOXIC REACTIONS.   CBC     Status: None   Collection Time: 06/04/14 12:08 AM  Result Value Ref Range   WBC 8.4 4.0 - 10.5 K/uL   RBC 5.14 4.22 - 5.81 MIL/uL   Hemoglobin 14.2 13.0 - 17.0 g/dL   HCT 44.0 39.0 -  52.0 %   MCV 85.6 78.0 - 100.0 fL   MCH 27.6 26.0 - 34.0 pg   MCHC 32.3 30.0 - 36.0 g/dL   RDW 12.6 11.5 - 15.5 %   Platelets 230 150 - 400 K/uL  Comprehensive metabolic panel     Status: Abnormal   Collection Time: 06/04/14 12:08 AM  Result Value Ref Range   Sodium 140 137 - 147 mEq/L   Potassium 3.6 (L) 3.7 - 5.3 mEq/L   Chloride 100 96 - 112 mEq/L   CO2 22 19 - 32 mEq/L   Glucose, Bld 101 (H) 70 - 99 mg/dL   BUN 9 6 - 23 mg/dL   Creatinine, Ser 0.94 0.50 - 1.35 mg/dL   Calcium 9.9 8.4 - 10.5 mg/dL   Total Protein 8.8 (H) 6.0 - 8.3 g/dL   Albumin 4.7 3.5 - 5.2 g/dL   AST 24 0 - 37 U/L   ALT 12 0 - 53 U/L   Alkaline Phosphatase 62 39 - 117 U/L   Total Bilirubin 0.4 0.3 - 1.2 mg/dL   GFR calc non Af Amer >90 >90 mL/min   GFR calc Af Amer >90 >90 mL/min    Comment: (NOTE) The eGFR has been calculated using the CKD EPI equation. This calculation has not been validated in all clinical situations. eGFR's persistently <90 mL/min signify possible Chronic Kidney Disease.    Anion gap 18 (H) 5 - 15  Ethanol (ETOH)     Status: None   Collection Time: 06/04/14 12:08 AM  Result Value Ref Range   Alcohol, Ethyl (B) <11 0 - 11 mg/dL    Comment:        LOWEST DETECTABLE LIMIT FOR SERUM ALCOHOL IS 11 mg/dL FOR MEDICAL PURPOSES ONLY   Salicylate level     Status: Abnormal   Collection Time: 06/04/14 12:08 AM  Result Value Ref Range   Salicylate Lvl <2.9 (L) 2.8 - 20.0 mg/dL   Labs are reviewed and are pertinent for unremarkable   Current Facility-Administered Medications  Medication Dose Route Frequency Provider Last Rate Last Dose  . acetaminophen (TYLENOL) tablet 650 mg  650 mg Oral Q4H PRN Antonietta Breach, PA-C      . alum & mag hydroxide-simeth (MAALOX/MYLANTA) 200-200-20 MG/5ML suspension 30 mL  30 mL Oral PRN Antonietta Breach, PA-C      . buPROPion (WELLBUTRIN XL) 24 hr tablet 150 mg  150 mg Oral Daily Waylan Boga, NP   150 mg at 06/05/14 1051  . divalproex (DEPAKOTE) DR tablet 250  mg  250 mg Oral Q12H Waylan Boga, NP   250 mg at 06/05/14 1051  . ibuprofen (ADVIL,MOTRIN) tablet 600 mg  600 mg Oral Q8H PRN Antonietta Breach, PA-C      . LORazepam (ATIVAN) tablet 1 mg  1 mg Oral Q8H PRN Antonietta Breach, PA-C      . nicotine (NICODERM CQ - dosed in mg/24 hours) patch 21 mg  21 mg Transdermal Daily Antonietta Breach, PA-C   21 mg at 06/04/14 4765  . ondansetron (ZOFRAN) tablet  4 mg  4 mg Oral Q8H PRN Antonietta Breach, PA-C      . risperiDONE (RISPERDAL) tablet 1 mg  1 mg Oral BID Waylan Boga, NP   1 mg at 06/05/14 1051  . zolpidem (AMBIEN) tablet 5 mg  5 mg Oral QHS PRN Antonietta Breach, PA-C       Current Outpatient Prescriptions  Medication Sig Dispense Refill  . ARIPiprazole (ABILIFY) 10 MG tablet Take 1 tablet (10 mg total) by mouth 2 (two) times daily. 60 tablet 0  . benztropine (COGENTIN) 0.5 MG tablet Take 1 tablet (0.5 mg total) by mouth 2 (two) times daily. 60 tablet 0  . buPROPion (WELLBUTRIN XL) 150 MG 24 hr tablet Take 1 tablet (150 mg total) by mouth daily. 30 tablet 0  . cephALEXin (KEFLEX) 500 MG capsule Take 1 capsule (500 mg total) by mouth every 12 (twelve) hours. Please take one capsule every twelve hours until finished to treat skin infection. Samples from the pharmacy were provided. (Patient not taking: Reported on 06/03/2014)    . diphenhydrAMINE (BENADRYL) 25 MG tablet Take 1 tablet (25 mg total) by mouth every 6 (six) hours as needed for itching or allergies (Rash). 15 tablet 0  . haloperidol (HALDOL) 10 MG tablet Take 1 tablet (10 mg total) by mouth daily. 30 tablet 0  . haloperidol (HALDOL) 20 MG tablet Take 1 tablet (20 mg total) by mouth every evening. 30 tablet 0  . haloperidol decanoate (HALDOL DECANOATE) 100 MG/ML injection Inject 1 mL (100 mg total) into the muscle every 14 (fourteen) days. First dose received on 03/29/14. Next dose due on 04/13/14. 1 mL   . hydrOXYzine (ATARAX/VISTARIL) 25 MG tablet Take 1 tablet (25 mg total) by mouth at bedtime as needed (sleep). 30  tablet 0  . hydrOXYzine (ATARAX/VISTARIL) 25 MG tablet Take 1 tablet (25 mg total) by mouth at bedtime as needed (sleep). 30 tablet 0  . lamoTRIgine (LAMICTAL) 100 MG tablet Take 1 tablet (100 mg total) by mouth 2 (two) times daily. 60 tablet 0  . traZODone (DESYREL) 50 MG tablet Take 1 tablet (50 mg total) by mouth at bedtime as needed for sleep. 30 tablet 0    Psychiatric Specialty Exam:     Blood pressure 124/68, pulse 68, temperature 98.5 F (36.9 C), temperature source Oral, resp. rate 16, SpO2 100 %.There is no weight on file to calculate BMI.  General Appearance: Fairly Groomed  Engineer, water::  Fair  Speech:  Slow- answers mostly in monosyllables   Volume:  Decreased  Mood:  denies depression, states mood is "OK"  Affect:  Blunt  Thought Process:  slow, concrete  Orientation:  Full (Time, Place, and Person)- was able to state month, year, and location   Thought Content:  denies hallucinations, does seem guarded   Suicidal Thoughts:  No- patient denies any SI, also denies having made any recent threats to family, denies any HI at this time  Homicidal Thoughts:  No  Memory:  recent and remote poor  Judgement:  Impaired  Insight:  Lacking  Psychomotor Activity:  Decreased  Concentration:  NA  Recall:  Poor  Fund of Knowledge:Fair  Language: Poor  Akathisia:  No  Handed:  Right  AIMS (if indicated):     Assets:  Physical Health Resilience  Sleep:      Musculoskeletal: Strength & Muscle Tone: within normal limits Gait & Station: gait not examined Patient leans: N/A  Treatment Plan Summary: At this time , based on  reports from family and well documented psychiatric history, I do think admission to inpatient psychiatric unit is warranted. Patient is currently IVCd, which was generated by his family . Currently waiting for bed to become available  Would continue Depakote ER 250 mgrs BID, Wellbutrin XL 150 mgrs QAM and  Increase Risperidone to 3 mgrs QHS  ,   06/05/2014 5:04 PM

## 2014-06-05 NOTE — BH Assessment (Signed)
[facility] Assessment Progress Note  [facility] from [facility]/2014 - 06/11/2014, authorization #[id number].  Please note that authorization does not mean that pt has been accepted.  [facility].  At 11:06 Junious DresserConnie called back to report that pt has been placed on their wait list.  Doylene Canninghomas Ova Gillentine, MA Triage Specialist 06/05/2014 @ 11:14

## 2014-06-05 NOTE — ED Notes (Signed)
Writer spoke with patient this morning. Patient denies SI/HI and A/V hallucinations to this Clinical research associatewriter. Patient has thought blocking at times. Patient is taking his medications for this Clinical research associatewriter. Patient denies pain. Patient was encouraged to shower due to poor hygiene and patient stated, "I am later." Patient was also encouraged to give urine sample but has not done so yet at this time. Patient signed for staff to talk to mother. This consent is in chart and treatment team was notified.

## 2014-06-05 NOTE — ED Notes (Signed)
Patient was encouraged to take a shower and given supplies. Patient was told to make sure he uses soap and his wash cloth. Patient's linens were changed. Linens were soaked with patient's urine. Patient has yet to give writer a sample of his urine although encouraged again. Q15 minute safety checks are continued.

## 2014-06-06 NOTE — ED Notes (Signed)
Patient continues to deny SI, HI, AVH. Patient is calm. Denies anxiety and feelings of depression. Patient is disheveled with body odor. Declines offer of fresh supplies to shower. Patient requests new ID band as he feels current band is "about to fall off".  Encouragement offered.  Q 15 safety checks continue.

## 2014-06-06 NOTE — Consult Note (Signed)
[place] [facility] Consult   Reason for Consult:  Report of disorganized, threatening behaviors at ho/me  Referring Physician:  ED Physician Mario [name] is an 23 y.o. male. Total Time spent with patient: 30 minutes  Assessment: AXIS I:  Schizoaffective Disorder AXIS II:  Deferred AXIS III:   Past Medical History  Diagnosis Date  . Bipolar 1 disorder   . Schizophrenia    AXIS IV:  economic problems and occupational problems AXIS V:  31-40 impairment in reality testing  Plan:  Recommend psychiatric Inpatient admission when medically cleared.  Subjective:   Mario [name] is a 23 y.o. male patient admitted with  Reports from family he has been disorganized, intermittently agitated and threatening.  HPI:  Patient is a 23 year old man, who has a psychiatric history. He has been diagnosed with Schizoaffective disorder and with Schizophrenia in the past. He has reportedly not been taking his prescribed psychiatric medications recently. Patient  continues to be a poor historian and denies any issues or symptoms. He answers most questions with monosyllables and denies any current symptoms. He states he does not know why he is in hospital. As per information in chart from his mother Mario [name], patient has been deteriorating since his recent psychiatric admission in September . He has been urinating on his bed and on himself, he has been refusing to eat or bathe. When encouraged to bathe , he has become angry and threatening towards his family, making threats to " kill them". Of note, patient denies any of this and states he has been " fine". He does state he has not been taking his medications because " I do not need them". Today continues to present with a blunted affect, presenting, poor verbalization. Denies depression, denies SI, denies having made any threats to family. Denies any drug or alcohol use.  Today patient is calm and cooperative.  Remains Disheveled with poor  hygiene.  Patient is odiferous of body odor  HPI Elements:   Exacerbation of chronic mental illness in the context of medication non compliance   Past Psychiatric History: Had recent psychiatric admission for psychosis ( 9/15) . Has history of Schizophrenia, has also been diagnosed with Schizoaffective Disorder in the past. Patient denies any history of violence or suicidal attempts Past Medical History  Diagnosis Date  . Bipolar 1 disorder   . Schizophrenia     reports that he has been smoking.  He does not have any smokeless tobacco history on file. He reports that he uses illicit drugs (Marijuana). He reports that he does not drink alcohol. No family history on file. Family History Substance Abuse: No Family Supports: Yes, List: (Mother ) Living Arrangements: Parent Can pt return to current living arrangement?: Yes Abuse/Neglect [facility] Physical Abuse: Denies Verbal Abuse: Denies Sexual Abuse: Denies Allergies:   Allergies  Allergen Reactions  . Carbamazepine     Face swelling  . Prolixin [Fluphenazine]      Objective: Blood pressure 122/76, pulse 86, temperature 98.6 F (37 C), temperature source Oral, resp. rate 16, SpO2 100 %.There is no weight on file to calculate BMI. Results for orders placed or performed during the hospital encounter of 06/03/14 (from the past 72 hour(s))  Acetaminophen level     Status: None   Collection Time: 06/04/14 12:08 AM  Result Value Ref Range   Acetaminophen (Tylenol), Serum <15.0 10 - 30 ug/mL    Comment:        THERAPEUTIC CONCENTRATIONS VARY SIGNIFICANTLY. A RANGE  OF 10-30 ug/mL MAY BE AN EFFECTIVE CONCENTRATION FOR MANY PATIENTS. HOWEVER, SOME ARE BEST TREATED AT CONCENTRATIONS OUTSIDE THIS RANGE. ACETAMINOPHEN CONCENTRATIONS >150 ug/mL AT 4 HOURS AFTER INGESTION AND >50 ug/mL AT 12 HOURS AFTER INGESTION ARE OFTEN ASSOCIATED WITH TOXIC REACTIONS.   CBC     Status: None   Collection Time: 06/04/14 12:08 AM  Result Value Ref  Range   WBC 8.4 4.0 - 10.5 K/uL   RBC 5.14 4.22 - 5.81 MIL/uL   Hemoglobin 14.2 13.0 - 17.0 g/dL   HCT 44.0 39.0 - 52.0 %   MCV 85.6 78.0 - 100.0 fL   MCH 27.6 26.0 - 34.0 pg   MCHC 32.3 30.0 - 36.0 g/dL   RDW 12.6 11.5 - 15.5 %   Platelets 230 150 - 400 K/uL  Comprehensive metabolic panel     Status: Abnormal   Collection Time: 06/04/14 12:08 AM  Result Value Ref Range   Sodium 140 137 - 147 mEq/L   Potassium 3.6 (L) 3.7 - 5.3 mEq/L   Chloride 100 96 - 112 mEq/L   CO2 22 19 - 32 mEq/L   Glucose, Bld 101 (H) 70 - 99 mg/dL   BUN 9 6 - 23 mg/dL   Creatinine, Ser 0.94 0.50 - 1.35 mg/dL   Calcium 9.9 8.4 - 10.5 mg/dL   Total Protein 8.8 (H) 6.0 - 8.3 g/dL   Albumin 4.7 3.5 - 5.2 g/dL   AST 24 0 - 37 U/L   ALT 12 0 - 53 U/L   Alkaline Phosphatase 62 39 - 117 U/L   Total Bilirubin 0.4 0.3 - 1.2 mg/dL   GFR calc non Af Amer >90 >90 mL/min   GFR calc Af Amer >90 >90 mL/min    Comment: (NOTE) The eGFR has been calculated using the CKD EPI equation. This calculation has not been validated in all clinical situations. eGFR's persistently <90 mL/min signify possible Chronic Kidney Disease.    Anion gap 18 (H) 5 - 15  Ethanol (ETOH)     Status: None   Collection Time: 06/04/14 12:08 AM  Result Value Ref Range   Alcohol, Ethyl (B) <11 0 - 11 mg/dL    Comment:        LOWEST DETECTABLE LIMIT FOR SERUM ALCOHOL IS 11 mg/dL FOR MEDICAL PURPOSES ONLY   Salicylate level     Status: Abnormal   Collection Time: 06/04/14 12:08 AM  Result Value Ref Range   Salicylate Lvl <2.9 (L) 2.8 - 20.0 mg/dL   Labs are reviewed and are pertinent for unremarkable   Current Facility-Administered Medications  Medication Dose Route Frequency Provider Last Rate Last Dose  . acetaminophen (TYLENOL) tablet 650 mg  650 mg Oral Q4H PRN Antonietta Breach, PA-C      . alum & mag hydroxide-simeth (MAALOX/MYLANTA) 200-200-20 MG/5ML suspension 30 mL  30 mL Oral PRN Antonietta Breach, PA-C      . buPROPion (WELLBUTRIN XL) 24  hr tablet 150 mg  150 mg Oral Daily Waylan Boga, NP   150 mg at 06/06/14 0948  . divalproex (DEPAKOTE) DR tablet 250 mg  250 mg Oral Q12H Waylan Boga, NP   250 mg at 06/06/14 0948  . ibuprofen (ADVIL,MOTRIN) tablet 600 mg  600 mg Oral Q8H PRN Antonietta Breach, PA-C      . LORazepam (ATIVAN) tablet 1 mg  1 mg Oral Q8H PRN Antonietta Breach, PA-C      . nicotine (NICODERM CQ - dosed in mg/24 hours) patch 21  mg  21 mg Transdermal Daily Antonietta Breach, PA-C   21 mg at 06/04/14 0086  . ondansetron (ZOFRAN) tablet 4 mg  4 mg Oral Q8H PRN Antonietta Breach, PA-C      . risperiDONE (RISPERDAL) tablet 3 mg  3 mg Oral QHS Fernando A Cobos, MD      . zolpidem (AMBIEN) tablet 5 mg  5 mg Oral QHS PRN Antonietta Breach, PA-C       Current Outpatient Prescriptions  Medication Sig Dispense Refill  . ARIPiprazole (ABILIFY) 10 MG tablet Take 1 tablet (10 mg total) by mouth 2 (two) times daily. 60 tablet 0  . benztropine (COGENTIN) 0.5 MG tablet Take 1 tablet (0.5 mg total) by mouth 2 (two) times daily. 60 tablet 0  . buPROPion (WELLBUTRIN XL) 150 MG 24 hr tablet Take 1 tablet (150 mg total) by mouth daily. 30 tablet 0  . cephALEXin (KEFLEX) 500 MG capsule Take 1 capsule (500 mg total) by mouth every 12 (twelve) hours. Please take one capsule every twelve hours until finished to treat skin infection. Samples from the pharmacy were provided. (Patient not taking: Reported on 06/03/2014)    . diphenhydrAMINE (BENADRYL) 25 MG tablet Take 1 tablet (25 mg total) by mouth every 6 (six) hours as needed for itching or allergies (Rash). 15 tablet 0  . haloperidol (HALDOL) 10 MG tablet Take 1 tablet (10 mg total) by mouth daily. 30 tablet 0  . haloperidol (HALDOL) 20 MG tablet Take 1 tablet (20 mg total) by mouth every evening. 30 tablet 0  . haloperidol decanoate (HALDOL DECANOATE) 100 MG/ML injection Inject 1 mL (100 mg total) into the muscle every 14 (fourteen) days. First dose received on 03/29/14. Next dose due on 04/13/14. 1 mL   . hydrOXYzine  (ATARAX/VISTARIL) 25 MG tablet Take 1 tablet (25 mg total) by mouth at bedtime as needed (sleep). 30 tablet 0  . hydrOXYzine (ATARAX/VISTARIL) 25 MG tablet Take 1 tablet (25 mg total) by mouth at bedtime as needed (sleep). 30 tablet 0  . lamoTRIgine (LAMICTAL) 100 MG tablet Take 1 tablet (100 mg total) by mouth 2 (two) times daily. 60 tablet 0  . traZODone (DESYREL) 50 MG tablet Take 1 tablet (50 mg total) by mouth at bedtime as needed for sleep. 30 tablet 0    Psychiatric Specialty Exam:     Blood pressure 122/76, pulse 86, temperature 98.6 F (37 C), temperature source Oral, resp. rate 16, SpO2 100 %.There is no weight on file to calculate BMI.  General Appearance: Disheveled  Eye Sport and exercise psychologist::  Fair  Speech:  Slow- answers mostly in monosyllables   Volume:  Decreased  Mood:  denies depression, states mood is "OK"  Affect:  Blunt  Thought Process:  slow, concrete  Orientation:  Full (Time, Place, and Person)- was able to state month, year, and location   Thought Content:  denies hallucinations, does seem guarded   Suicidal Thoughts:  No- patient denies any SI, also denies having made any recent threats to family, denies any HI at this time  Homicidal Thoughts:  No  Memory:  recent and remote poor  Judgement:  Impaired  Insight:  Lacking  Psychomotor Activity:  Decreased  Concentration:  NA  Recall:  Poor  Fund of Knowledge:Fair  Language: Poor  Akathisia:  No  Handed:  Right  AIMS (if indicated):     Assets:  Physical Health Resilience  Sleep:      Musculoskeletal: Strength & Muscle Tone: within normal limits Gait &  Station: gait not examined Patient leans: N/A  Treatment Plan Summary: At this time , based on reports from family and well documented psychiatric history, I do think admission to inpatient psychiatric unit is warranted. Patient is currently IVCd, which was generated by his family . Currently waiting for bed to become available  Continue current regimen  Depakote  ER 250 mgrs BID, Wellbutrin XL 150 mgrs QAM and  Increase Risperidone to 3 mgrs QHS  Kennedy Bucker PMH-NP  06/06/2014 4:59 PM  Patient seen, evaluated and I agree with notes by Nurse Practitioner. Corena Pilgrim, MD

## 2014-06-06 NOTE — ED Notes (Signed)
Patient has been isolative to room.  Patient has poor hygiene and has been incontinent of urine.  Patient was encouraged to take a shower and odor has improved.  He has been observed staring at the wall with little or no interaction with staff.  He denies any SI/HI/AVH.  Patient is on wait list for [facility] at this time.

## 2014-06-06 NOTE — Progress Notes (Signed)
CSW confirmed with Junious DresserConnie [facility].   Byrd HesselbachKristen Chancellor Vanderloop, LCSW [phone]  ED CSW 06/06/2014 9:11 AM

## 2014-06-07 ENCOUNTER — Encounter (HOSPITAL_COMMUNITY): Payer: Self-pay | Admitting: Registered Nurse

## 2014-06-07 DIAGNOSIS — R4689 Other symptoms and signs involving appearance and behavior: Secondary | ICD-10-CM | POA: Insufficient documentation

## 2014-06-07 MED ORDER — BENZTROPINE MESYLATE 0.5 MG PO TABS: 0.5000 mg | ORAL_TABLET | Freq: Every day | ORAL | Status: DC

## 2014-06-07 MED ORDER — ARIPIPRAZOLE ER 400 MG IM SUSR
400.0000 mg | Freq: Once | INTRAMUSCULAR | Status: AC
  Administered 2014-06-07: 400 mg via INTRAMUSCULAR

## 2014-06-07 MED ORDER — ARIPIPRAZOLE 2 MG PO TABS
2.0000 mg | ORAL_TABLET | Freq: Once | ORAL | Status: AC
  Administered 2014-06-07: 2 mg via ORAL
  Filled 2014-06-07: qty 1

## 2014-06-07 MED ORDER — BUPROPION HCL ER (XL) 150 MG PO TB24: 150.0000 mg | ORAL_TABLET | Freq: Every day | ORAL | Status: DC

## 2014-06-07 MED ORDER — ARIPIPRAZOLE 10 MG PO TABS: 10.0000 mg | ORAL_TABLET | Freq: Every day | ORAL | Status: DC

## 2014-06-07 NOTE — Discharge Instructions (Signed)
Aggression Physically aggressive behavior is common among small children. When frustrated or angry, toddlers may act out. Often, they will push, bite, or hit. Most children show less physical aggression as they grow up. Their language and interpersonal skills improve, too. But continued aggressive behavior is a sign of a problem. This behavior can lead to aggression and delinquency in adolescence and adulthood. Aggressive behavior can be psychological or physical. Forms of psychological aggression include threatening or bullying others. Forms of physical aggression include:  Pushing.  Hitting.  Slapping.  Kicking.  Stabbing.  Shooting.  Raping. PREVENTION  Encouraging the following behaviors can help manage aggression:  Respecting others and valuing differences.  Participating in school and community functions, including sports, music, after-school programs, community groups, and volunteer work.  Talking with an adult when they are sad, depressed, fearful, anxious, or angry. Discussions with a parent or other family member, Veterinary surgeoncounselor, Runner, broadcasting/film/videoteacher, or coach can help.  Avoiding alcohol and drug use.  Dealing with disagreements without aggression, such as conflict resolution. To learn this, children need parents and caregivers to model respectful communication and problem solving.  Limiting exposure to aggression and violence, such as video games that are not age appropriate, violence in the media, or domestic violence. Document Released: 04/19/2007 Document Revised: 09/14/2011 Document Reviewed: 08/28/2010 [facility] Patient Information 2015 [place] [place]. This information is not intended to replace advice given to you by your health care provider. Make sure you discuss any questions you have with your health care provider.    For your ongoing mental health needs, [facility].  To re-start treatment with them, you will need to go to their walk-in clinic.   Walk-in hours are Monday - Friday 8:00 am - 3:00 pm.  Patients are seen on a first come, first serve basis, so plan to be there as early as possible for the best chance of being seen that day.  Once you are re-started you will be able to schedule routine appointments with them:       [facility]      [address]      [place], [place] [address]      [phone]

## 2014-06-07 NOTE — ED Notes (Signed)
Patient has stayed in his room most of the day.  Did get up this morning to shower.  Room was cleaned while he was up.  He is to be discharged today.  He called his mother, and states she will be up to get him, but not right away.  She asked to speak to me on the phone and wondered what happened with [facility].  I explained that he has been taking his medications here and has been started on a long acting injectable medication.

## 2014-06-07 NOTE — ED Notes (Signed)
Patient discharged to home.  Left the unit ambulatory with staff.  Patient was incontinent of urine and refused to get cleaned up or change clothes prior to leaving.

## 2014-06-07 NOTE — Consult Note (Signed)
[facility]   Reason for Consult:  Report of disorganized, threatening behaviors at ho/me  Referring Physician:  ED Physician Olga CoasterJonathan E [name] is an 23 y.o. male. Total Time spent with patient: 30 minutes  Assessment: AXIS I:  Schizoaffective Disorder AXIS II:  Deferred AXIS III:   Past Medical History  Diagnosis Date  . Bipolar 1 disorder   . Schizophrenia    AXIS IV:  economic problems and occupational problems AXIS V:  31-40 impairment in reality testing  Plan:  Recommend psychiatric Inpatient admission when medically cleared.  Subjective:   Olga CoasterJonathan E [name] is a 23 y.o. male patient admitted with  Reports from family he has been disorganized, intermittently agitated and threatening.  HPI:  Patient state that he is feeling better.  Patient waking up and is very odorous.  Patient instructed to shower.  Patient has been compliment  since he has been here in the hospital.  After getting out of shower patient continued to have a slight odor where he has not cleaned himself good. Patient appears to have lack of self motivation for self care.   Discussed outpatient services and taking a bath once he is home.    HPI Elements:   Exacerbation of chronic mental illness in the context of medication non compliance   Past Psychiatric History: Had recent psychiatric admission for psychosis ( 9/15) . Has history of Schizophrenia, has also been diagnosed with Schizoaffective Disorder in the past. Patient denies any history of violence or suicidal attempts Past Medical History  Diagnosis Date  . Bipolar 1 disorder   . Schizophrenia     reports that he has been smoking.  He does not have any smokeless tobacco history on file. He reports that he uses illicit drugs (Marijuana). He reports that he does not drink alcohol. History reviewed. No pertinent family history. Family History Substance Abuse: No Family Supports: Yes, List: (Mother ) Living Arrangements:  Parent Can pt return to current living arrangement?: Yes Abuse/Neglect [facility] Physical Abuse: Denies Verbal Abuse: Denies Sexual Abuse: Denies Allergies:   Allergies  Allergen Reactions  . Carbamazepine     Face swelling  . Prolixin [Fluphenazine]      Objective: Blood pressure 114/76, pulse 95, temperature 98.4 F (36.9 C), temperature source Oral, resp. rate 16, SpO2 100 %.There is no weight on file to calculate BMI. No results found for this or any previous visit (from the past 72 hour(s)). Labs are reviewed and are pertinent for unremarkable   Current Facility-Administered Medications  Medication Dose Route Frequency Provider Last Rate Last Dose  . acetaminophen (TYLENOL) tablet 650 mg  650 mg Oral Q4H PRN Antony MaduraKelly Humes, PA-C      . alum & mag hydroxide-simeth (MAALOX/MYLANTA) 200-200-20 MG/5ML suspension 30 mL  30 mL Oral PRN Antony MaduraKelly Humes, PA-C      . buPROPion (WELLBUTRIN XL) 24 hr tablet 150 mg  150 mg Oral Daily Nanine MeansJamison Lord, NP   150 mg at 06/07/14 0959  . divalproex (DEPAKOTE) DR tablet 250 mg  250 mg Oral Q12H Nanine MeansJamison Lord, NP   250 mg at 06/07/14 0959  . ibuprofen (ADVIL,MOTRIN) tablet 600 mg  600 mg Oral Q8H PRN Antony MaduraKelly Humes, PA-C      . LORazepam (ATIVAN) tablet 1 mg  1 mg Oral Q8H PRN Antony MaduraKelly Humes, PA-C      . nicotine (NICODERM CQ - dosed in mg/24 hours) patch 21 mg  21 mg Transdermal Daily Antony MaduraKelly Humes, PA-C   21 mg  at 06/04/14 [id number]  . ondansetron (ZOFRAN) tablet 4 mg  4 mg Oral Q8H PRN Antony MaduraKelly Humes, PA-C      . risperiDONE (RISPERDAL) tablet 3 mg  3 mg Oral QHS Rockey SituFernando A Cobos, MD   3 mg at 06/06/14 2331  . zolpidem (AMBIEN) tablet 5 mg  5 mg Oral QHS PRN Antony MaduraKelly Humes, PA-C       Current Outpatient Prescriptions  Medication Sig Dispense Refill  . ARIPiprazole (ABILIFY) 10 MG tablet Take 1 tablet (10 mg total) by mouth 2 (two) times daily. 60 tablet 0  . benztropine (COGENTIN) 0.5 MG tablet Take 1 tablet (0.5 mg total) by mouth 2 (two) times daily. 60 tablet 0  .  buPROPion (WELLBUTRIN XL) 150 MG 24 hr tablet Take 1 tablet (150 mg total) by mouth daily. 30 tablet 0  . cephALEXin (KEFLEX) 500 MG capsule Take 1 capsule (500 mg total) by mouth every 12 (twelve) hours. Please take one capsule every twelve hours until finished to treat skin infection. Samples from the pharmacy were provided. (Patient not taking: Reported on 06/03/2014)    . diphenhydrAMINE (BENADRYL) 25 MG tablet Take 1 tablet (25 mg total) by mouth every 6 (six) hours as needed for itching or allergies (Rash). 15 tablet 0  . haloperidol (HALDOL) 10 MG tablet Take 1 tablet (10 mg total) by mouth daily. 30 tablet 0  . haloperidol (HALDOL) 20 MG tablet Take 1 tablet (20 mg total) by mouth every evening. 30 tablet 0  . haloperidol decanoate (HALDOL DECANOATE) 100 MG/ML injection Inject 1 mL (100 mg total) into the muscle every 14 (fourteen) days. First dose received on 03/29/14. Next dose due on 04/13/14. 1 mL   . hydrOXYzine (ATARAX/VISTARIL) 25 MG tablet Take 1 tablet (25 mg total) by mouth at bedtime as needed (sleep). 30 tablet 0  . hydrOXYzine (ATARAX/VISTARIL) 25 MG tablet Take 1 tablet (25 mg total) by mouth at bedtime as needed (sleep). 30 tablet 0  . lamoTRIgine (LAMICTAL) 100 MG tablet Take 1 tablet (100 mg total) by mouth 2 (two) times daily. 60 tablet 0  . traZODone (DESYREL) 50 MG tablet Take 1 tablet (50 mg total) by mouth at bedtime as needed for sleep. 30 tablet 0    Psychiatric Specialty Exam:     Blood pressure 114/76, pulse 95, temperature 98.4 F (36.9 C), temperature source Oral, resp. rate 16, SpO2 100 %.There is no weight on file to calculate BMI.  General Appearance: Disheveled  Eye SolicitorContact::  Fair  Speech:  Slow- answers mostly in monosyllables   Volume:  Decreased  Mood:  "I'm okay"  Affect:  Blunt  Thought Process:  slow, concrete  Orientation:  Full (Time, Place, and Person)  Thought Content:  Denies auditory/visual hallucination  Suicidal Thoughts:  No  Homicidal  Thoughts:  No  Memory:  Immediate;   Fair Recent;   Fair Remote;   Fair  Judgement:  Fair  Insight:  Lacking  Psychomotor Activity:  Decreased  Concentration:  NA  Recall:  Fis[employer]Fair  Fund of Knowledge:Fair  Language: Poor  Akathisia:  No  Handed:  Right  AIMS (if indicated):     Assets:  Physical Health Resilience  Sleep:      Musculoskeletal: Strength & Muscle Tone: within normal limits Gait & Station: gait not examined Patient leans: N/A  Treatment Plan Summary:  Patient started on Abilify 400 mg IM monthly  Discharge home.  [facility]   Rankin, Shuvon FNP-BC  06/07/2014 1:27 PM   Patient seen, evaluated and I agree with notes by Nurse Practitioner. Thedore Mins, MD

## 2014-06-07 NOTE — BH Assessment (Addendum)
Inpt recommended. Pt on [facility] wait [facility]. Sent referral to the following facilities for [facility] as well: [facility]- declined due to needing higher level of care per Para SkeansMarguerite   Tasheena Wambolt, [facility] T[facility] Specialist 06/07/2014 12:43 AM

## 2014-06-07 NOTE — Progress Notes (Signed)
CSW confirmed with Junious Dresseronnie [facility].  [facility]  [facility], [address], [facility], [facility].   Byrd HesselbachKristen Jennifer Holland, LCSW [phone]  ED CSW 06/07/2014 912am

## 2014-06-07 NOTE — [facility] Suicide Risk Assessment (Cosign Needed)
Suicide Risk Assessment  Discharge Assessment     Demographic Factors:  Male  Total Time spent with patient: 30 minutes  Psychiatric Specialty Exam: AXIS I: Major Depression, Recurrent severe AXIS II: Deferred AXIS III:  Past Medical History  Diagnosis Date  . Depression    AXIS IV: other psychosocial or environmental problems and problems related to social environment AXIS V: 61-70 mild symptoms   Mental Status Per Nursing Assessment::   On Admission:     Current Mental Status by Physician: Denies suicidal/homicidal ideation, psychosis, and paranoia  Loss Factors: NA  Historical Factors: NA  Risk Reduction Factors:   Positive social support  Continued Clinical Symptoms:  Previous Psychiatric Diagnoses and Treatments  Cognitive Features That Contribute To Risk:  Closed-mindedness    Suicide Risk:  Minimal: No identifiable suicidal ideation.  Patients presenting with no risk factors but with morbid ruminations; may be classified as minimal risk based on the severity of the depressive symptoms  Discharge Diagnoses:   Plan Of Care/Follow-up recommendations:  Activity:  as tolerated Diet:  as tolerated  Is patient on multiple antipsychotic therapies at discharge:  No   Has Patient had three or more failed trials of antipsychotic monotherapy by history:  No  Recommended Plan for Multiple Antipsychotic Therapies: NA    Daton Szilagyi, FNP-BC 06/07/2014, 2:00 PM

## 2018-11-24 ENCOUNTER — Emergency Department (HOSPITAL_COMMUNITY)
Admission: EM | Admit: 2018-11-24 | Discharge: 2018-11-27 | Disposition: A | Discharge status: Other Acute Inpatient Hospital | Payer: Self-pay | Attending: Emergency Medicine | Admitting: Emergency Medicine

## 2018-11-24 ENCOUNTER — Other Ambulatory Visit: Payer: Self-pay

## 2018-11-24 ENCOUNTER — Encounter (HOSPITAL_COMMUNITY): Payer: Self-pay

## 2018-11-24 DIAGNOSIS — F250 Schizoaffective disorder, bipolar type: Secondary | ICD-10-CM | POA: Insufficient documentation

## 2018-11-24 DIAGNOSIS — F29 Unspecified psychosis not due to a substance or known physiological condition: Secondary | ICD-10-CM | POA: Diagnosis present

## 2018-11-24 DIAGNOSIS — Z79899 Other long term (current) drug therapy: Secondary | ICD-10-CM | POA: Insufficient documentation

## 2018-11-24 DIAGNOSIS — R443 Hallucinations, unspecified: Secondary | ICD-10-CM | POA: Insufficient documentation

## 2018-11-24 DIAGNOSIS — Z1159 Encounter for screening for other viral diseases: Secondary | ICD-10-CM | POA: Insufficient documentation

## 2018-11-24 DIAGNOSIS — H1032 Unspecified acute conjunctivitis, left eye: Secondary | ICD-10-CM | POA: Insufficient documentation

## 2018-11-24 DIAGNOSIS — R4689 Other symptoms and signs involving appearance and behavior: Secondary | ICD-10-CM | POA: Diagnosis present

## 2018-11-24 DIAGNOSIS — F17210 Nicotine dependence, cigarettes, uncomplicated: Secondary | ICD-10-CM | POA: Insufficient documentation

## 2018-11-24 DIAGNOSIS — F1210 Cannabis abuse, uncomplicated: Secondary | ICD-10-CM | POA: Diagnosis present

## 2018-11-24 LAB — COMPREHENSIVE METABOLIC PANEL
ALT: 22 U/L (ref 0–44)
AST: 36 U/L (ref 15–41)
Albumin: 4.4 g/dL (ref 3.5–5.0)
Alkaline Phosphatase: 62 U/L (ref 38–126)
Anion gap: 15 (ref 5–15)
BUN: 12 mg/dL (ref 6–20)
CO2: 21 mmol/L — ABNORMAL LOW (ref 22–32)
Calcium: 9.7 mg/dL (ref 8.9–10.3)
Chloride: 105 mmol/L (ref 98–111)
Creatinine, Ser: 0.96 mg/dL (ref 0.61–1.24)
GFR calc Af Amer: >60 mL/min (ref >60)
GFR calc non Af Amer: >60 mL/min (ref >60)
Glucose, Bld: 94 mg/dL (ref 70–99)
Potassium: 3.7 mmol/L (ref 3.5–5.1)
Sodium: 141 mmol/L (ref 135–145)
Total Bilirubin: 0.8 mg/dL (ref 0.3–1.2)
Total Protein: 8 g/dL (ref 6.5–8.1)

## 2018-11-24 LAB — CBC WITH DIFFERENTIAL/PLATELET
Abs Immature Granulocytes: 0.03 10*3/uL (ref 0.00–0.07)
Basophils Absolute: 0 10*3/uL (ref 0.0–0.1)
Basophils Relative: 0 %
Eosinophils Absolute: 0.1 10*3/uL (ref 0.0–0.5)
Eosinophils Relative: 1 %
HCT: 44.8 % (ref 39.0–52.0)
Hemoglobin: 14 g/dL (ref 13.0–17.0)
Immature Granulocytes: 0 %
Lymphocytes Relative: 17 %
Lymphs Abs: 1.8 10*3/uL (ref 0.7–4.0)
MCH: 27.6 pg (ref 26.0–34.0)
MCHC: 31.3 g/dL (ref 30.0–36.0)
MCV: 88.4 fL (ref 80.0–100.0)
Monocytes Absolute: 0.7 10*3/uL (ref 0.1–1.0)
Monocytes Relative: 7 %
Neutro Abs: 7.9 10*3/uL — ABNORMAL HIGH (ref 1.7–7.7)
Neutrophils Relative %: 75 %
Platelets: 240 10*3/uL (ref 150–400)
RBC: 5.07 MIL/uL (ref 4.22–5.81)
RDW: 12.4 % (ref 11.5–15.5)
WBC: 10.5 10*3/uL (ref 4.0–10.5)
nRBC: 0 % (ref 0.0–0.2)

## 2018-11-24 LAB — RAPID URINE DRUG SCREEN, HOSP PERFORMED
Amphetamines: NOT DETECTED
Barbiturates: NOT DETECTED
Benzodiazepines: NOT DETECTED
Cocaine: NOT DETECTED
Opiates: NOT DETECTED
Tetrahydrocannabinol: POSITIVE — AB

## 2018-11-24 LAB — ETHANOL: Alcohol, Ethyl (B): <10 mg/dL (ref <10)

## 2018-11-24 LAB — ACETAMINOPHEN LEVEL: Acetaminophen (Tylenol), Serum: <10 ug/mL — ABNORMAL LOW (ref 10–30)

## 2018-11-24 LAB — SALICYLATE LEVEL: Salicylate Lvl: <7 mg/dL (ref 2.8–30.0)

## 2018-11-24 LAB — SARS CORONAVIRUS 2 BY RT PCR (HOSPITAL ORDER, PERFORMED IN ~~LOC~~ HOSPITAL LAB): SARS Coronavirus 2: NEGATIVE

## 2018-11-24 MED ORDER — ACETAMINOPHEN 325 MG PO TABS: 650.0000 mg | ORAL_TABLET | ORAL | Status: DC | PRN

## 2018-11-24 MED ORDER — NICOTINE 21 MG/24HR TD PT24: 21.0000 mg | MEDICATED_PATCH | Freq: Every day | TRANSDERMAL | Status: DC

## 2018-11-24 MED ORDER — ERYTHROMYCIN 5 MG/GM OP OINT: TOPICAL_OINTMENT | OPHTHALMIC | 0 refills | Status: DC

## 2018-11-24 MED ORDER — ALUM & MAG HYDROXIDE-SIMETH 200-200-20 MG/5ML PO SUSP: 30.0000 mL | Freq: Four times a day (QID) | ORAL | Status: DC | PRN

## 2018-11-24 NOTE — ED Notes (Signed)
Pt currently being assessed by behavioral health.  

## 2018-11-24 NOTE — ED Notes (Signed)
Got patient into purple scrubs patient is resting with call bell in reach

## 2018-11-24 NOTE — ED Notes (Signed)
Dinner tray ordered.

## 2018-11-24 NOTE — ED Notes (Addendum)
Pt belongings placed in locker 3 (Clothing soiled)  Valuables with security [id number] IVC paperwork faxed at [phone]  11/24/18

## 2018-11-24 NOTE — BH Assessment (Signed)
Tele Assessment Note   Patient Name: Mario [name] MRN: [id number] Referring Physician: Linwood Dibbles, PA-C Location of Patient: [facility] Location of Provider: Behavioral Health TTS Department  Mario [name] is a 28 y.o. male IVC'd and brought to [facility] to be evaluated due to making threats to others and and himself.  Pt stated "I don't know why I'm here.  My mom called the police and they brought me here."   According to the IVC paperwork the  "Pt is Bipolar not taking prescribed medications, hallucinating, stating someone is telling me to do things.  Threatening to kill family members and physically hiting family members, walking into traffic daily per police."    Pt admits substance use: Alcohol "1 shot every so often", last used 1 week ago." Pt reports receiving ongoing MH outpatient treatment at [facility].  Pt states "I stopped taking my medication 1 week ago".  Pt reports that having a diagnosis of Schizophrenia from [facility]. Pt reports receiving inpatient treatment at [facility]. Pt denies SI/HI/A/V-hallucinations.  Patient was wearing scrubs and appeared inappropriately groomed.  Pt was alert throughout the assessment.  Patient made stared blankly and had no psychomotor activity.  Patient spoke in a flat voice without pressured speech.  Pt expressed feeling "nothing".  Pt's affect was flat and congruent with stated mood.Marland Kitchen  Pt presented with poor) insight and judgement.  Pt did not appear to responding to internal stimuli.  Pt was not able to reliably contract for safety.  Disposition: [facility] discussed case with BH Provider, Denzil Magnuson, NP who recommended inpatient treatment. TTS will look for inpatient placement.   Diagnosis: F25.0 Schizoaffective Disorder Bipolar Type  Past Medical History:  Past Medical History:  Diagnosis Date  . Bipolar 1 disorder (HCC)   . Schizophrenia (HCC)     History reviewed. No pertinent surgical  history.  Family History: History reviewed. No pertinent family history.  Social History:  reports that he has been smoking. He has been smoking about 0.50 packs per day. He has never used smokeless tobacco. He reports current drug use. Drug: Marijuana. He reports that he does not drink alcohol.  Additional Social History:  Alcohol / Drug Use Pain Medications: See MARs Prescriptions: See MARs Over the Counter: See MARs History of alcohol / drug use?: Yes Substance #1 Name of Substance 1: Alcohol 1 - Age of First Use: unknown 1 - Amount (size/oz): 1 shot 1 - Frequency: "every once in a while 1 - Duration: ongoing 1 - Last Use / Amount: 1 week ago  CIWA: CIWA-Ar BP: (!) 133/96 Pulse Rate: 62 COWS:    Allergies:  Allergies  Allergen Reactions  . Carbamazepine     Face swelling  . Prolixin [Fluphenazine]     Home Medications: (Not in a hospital admission)   OB/GYN Status:  No LMP for male patient.  General Assessment Data Assessment unable to be completed: Yes Reason for not completing assessment: multiple walk ins Location of Assessment: [facility] ED TTS Assessment: In system Is this a Tele or Face-to-Face Assessment?: Tele Assessment Is this an Initial Assessment or a Re-assessment for this encounter?: Initial Assessment Patient Accompanied by:: N/A Language Other than English: No Living Arrangements: Other (Comment) What gender do you identify as?: Male Marital status: Single Living Arrangements: Parent Can pt return to current living arrangement?: Yes Admission Status: Involuntary Petitioner: Family member Is patient capable of signing voluntary admission?: No Referral Source: Self/Family/Friend(IVC'd)     Crisis Care Plan  Living Arrangements: Parent Name [facility]iatrist: [facility] Name of Therapist: NA  Education Status Is patient currently in school?: No Is the patient employed, unemployed or receiving disability?: Unemployed  Risk to self with the past 6  months Suicidal Ideation: No Has patient been a risk to self within the past 6 months prior to admission? : No Suicidal Intent: No Has patient had any suicidal intent within the past 6 months prior to admission? : No Is patient at risk for suicide?: No Suicidal Plan?: No Has patient had any suicidal plan within the past 6 months prior to admission? : No Access to Means: No What has been your use of drugs/alcohol within the last 12 months?: Alcohol Previous Attempts/Gestures: No Triggers for Past Attempts: None known Intentional Self Injurious Behavior: None Family Suicide History: No Persecutory voices/beliefs?: No Depression: No Substance abuse history and/or treatment for substance abuse?: No Suicide prevention information given to non-admitted patients: Not applicable  Risk to Others within the past 6 months Homicidal Ideation: No Does patient have any lifetime risk of violence toward others beyond the six months prior to admission? : No Thoughts of Harm to Others: No Current Homicidal Intent: No Current Homicidal Plan: No Access to Homicidal Means: No History of harm to others?: No Assessment of Violence: None Noted Does patient have access to weapons?: No(Pt denies) Criminal Charges Pending?: No Does patient have a court date: No Is patient on probation?: No  Psychosis Hallucinations: Auditory Delusions: None noted  Mental Status Report Appearance/Hygiene: In scrubs Eye Contact: Fair Motor Activity: Freedom of movement Speech: Slow Level of Consciousness: Quiet/awake Mood: Other (Comment) Affect: Flat Anxiety Level: None Thought Processes: Unable to Assess Judgement: Partial Orientation: Person, Not oriented Obsessive Compulsive Thoughts/Behaviors: None  Cognitive Functioning Concentration: Fair Memory: Unable to Assess Is patient IDD: No Insight: Unable to Assess Impulse Control: Unable to Assess Appetite: Fair Have you had any weight changes? : No  Change Sleep: Decreased Total Hours of Sleep: 5 Vegetative Symptoms: Not bathing, Decreased grooming(per IVC paperwork)  ADLScreening [facility] Assessment Services) Patient's cognitive ability adequate to safely complete daily activities?: Yes Patient able to express need for assistance with ADLs?: Yes Independently performs ADLs?: Yes (appropriate for developmental age)  Prior Inpatient Therapy Prior Inpatient Therapy: Yes Prior Therapy Dates: unknown [facility] Reason for Treatment: MH  Prior Outpatient Therapy Prior Outpatient Therapy: Yes Prior Therapy Dates: ongoing Prior Therapy Facilty/Provider(s): [facility] Reason for Treatment: MH Does patient have an ACCT team?: No Does patient have Intensive In-House Services?  : No Does patient have [facility] services? : No Does patient have P4CC services?: No  ADL Screening (condition at time of admission) Patient's cognitive ability adequate to safely complete daily activities?: Yes Is the patient deaf or have difficulty hearing?: No Does the patient have difficulty seeing, even when wearing glasses/contacts?: No Does the patient have difficulty concentrating, remembering, or making decisions?: No Patient able to express need for assistance with ADLs?: Yes Does the patient have difficulty dressing or bathing?: No Independently performs ADLs?: Yes (appropriate for developmental age) Does the patient have difficulty walking or climbing stairs?: No Weakness of Legs: None Weakness of Arms/Hands: None  Home Assistive Devices/Equipment Home Assistive Devices/Equipment: None    Abuse/Neglect Assessment (Assessment to be complete while patient is alone) Abuse/Neglect Assessment Can Be Completed: Yes Physical Abuse: Denies Verbal Abuse: Denies Sexual Abuse: Denies Exploitation of patient/patient's resources: Denies Self-Neglect: Denies Values / Beliefs Cultural Requests During  Hospitalization: None Spiritual Requests During  Hospitalization: None   Advance Directives (For Healthcare) Does Patient Have a Medical Advance Directive?: No Would patient like information on creating a medical advance directive?: No - Patient declined          Disposition: [facility] discussed case with BH Provider, Denzil Magnuson, NP who recommended inpatient treatment. TTS will look for inpatient placement.  Disposition Initial Assessment Completed for this Encounter: Yes Disposition of Patient: Admit(Per Denzil Magnuson, NP) Type of inpatient treatment program: Adult Patient refused recommended treatment: No Mode of transportation if patient is discharged/movement?: N/A Patient referred to: Other (Comment)(Inpatient placement sought)  This service was provided via telemedicine using a 2-way, interactive audio and video technology.  Names of all persons participating in this telemedicine service and their role in this encounter. Name: Mario [name] Role: Patient  Name: Jacarri Gesner L. Darcy Cordner, MS, [facility], NCC Role: Triage Specialist  Name: Denzil Magnuson, NP Role: [facility] Provider  Name:  Role:     Tyron Russell, MS, [facility], NCC 11/24/2018 4:50 PM

## 2018-11-24 NOTE — ED Triage Notes (Signed)
Pt with hx of bipolar and schizophrenia presents to ED with [place] [employer] [place] [employer], IVC'd by pt's grandmother.  IVC states that pt is not taking prescribed medications and is a threat to self and others.  It is reported that pt is threatening to kill his family members, hitting his younger siblings and is having auditory hallucinations and talking to people who aren't there.  Upon arrival to ED pt denies SI/HI/AH/VH.  Pt cannot recall the medications he is prescribed.  Pt is Alert and Oriented x4.

## 2018-11-24 NOTE — Progress Notes (Signed)
Pt meets inpatient criteria per Denzil Magnuson, NP. Referral information has been sent to the following hospitals for review:  [facility]  [facility]  [facility]  [facility]  [facility]  [facility]  [facility]  [facility]  [facility]  [facility]   Disposition will continue to assist with inpatient placement needs.   Wells Guiles, LCSW, LCAS Disposition CSW [facility] BHH/TTS [phone]

## 2018-11-24 NOTE — ED Notes (Signed)
No belongings with pt on arrival to room 52.

## 2018-11-24 NOTE — BHH Counselor (Signed)
  BH ASSESSMENT DISPOSITION  [facility] discussed case with BH Provider, Denzil Magnuson, NP who recommended inpatient treatment. TTS will look for inpatient placement.  Mario [name] L. Nikolina Simerson, MS, [facility], [facility] Therapeutic Triage Specialist  [phone]

## 2018-11-24 NOTE — ED Provider Notes (Signed)
Mario [name] [facility] EMERGENCY DEPARTMENT Provider Note   CSN: [id number] Arrival date & time: 11/24/18  1057  History   Chief Complaint Chief Complaint  Patient presents with  . Schizophrenia   HPI CULVER DRAIN is a 28 y.o. male with past medical history significant for her disorder, schizophrenia who presents under IVC petition via Grandmother and police.  Per police family patient is had aggressive behavior at home.  He has had several of his younger family members.  Family states patient is not taking care of himself, going weeks without bathing or brushing his teeth.  He is not taking any of his psychiatric medications.  Per IVC, family states patient has been hallucinating stating that someone is talking to him telling him to do things.  Per police they have arrested him multiple times over the last 2 weeks for trying to walk out into traffic.  They found patient today walking in the middle of the street in the rain. [facility]. Patient denies current SI, HI. States he does "hear thing" however will not elaborate on his hallucinations. Denis illicit drug use or alcohol use.  Patient states he does not know why he is here.  History provide Police, IVC and prior medical records. No interpretor was used.     HPI  Past Medical History:  Diagnosis Date  . Bipolar 1 disorder (HCC)   . Schizophrenia [facility])     Patient Active Problem List   Diagnosis Date Noted  . Aggression   . Multiple wounds of skin 03/26/2014  . Episodic mood disorder (HCC) 02/16/2013  . Cannabis abuse 02/14/2013  . Psychotic disorder (HCC) 02/12/2013    History reviewed. No pertinent surgical history.      Home Medications    Prior to Admission medications   Medication Sig Start Date End Date Taking? Authorizing Provider  ARIPiprazole (ABILIFY) 10 MG tablet Take 1 tablet (10 mg total) by mouth daily. 06/07/14 06/21/14  Rankin, Shuvon B, NP  benztropine  (COGENTIN) 0.5 MG tablet Take 1 tablet (0.5 mg total) by mouth daily. 06/07/14   Rankin, Shuvon B, NP  buPROPion (WELLBUTRIN XL) 150 MG 24 hr tablet Take 1 tablet (150 mg total) by mouth daily. 06/07/14   Rankin, Shuvon B, NP  diphenhydrAMINE (BENADRYL) 25 MG tablet Take 1 tablet (25 mg total) by mouth every 6 (six) hours as needed for itching or allergies (Rash). 03/15/14   Piepenbrink, Victorino Dike, PA-C  erythromycin ophthalmic ointment Place a 1/2 inch ribbon of ointment into the lower eyelid. 11/24/18   Makyi Ledo A, PA-C  hydrOXYzine (ATARAX/VISTARIL) 25 MG tablet Take 1 tablet (25 mg total) by mouth at bedtime as needed (sleep). 02/17/13   Thermon Leyland, NP  traZODone (DESYREL) 50 MG tablet Take 1 tablet (50 mg total) by mouth at bedtime as needed for sleep. 03/30/14   Thermon Leyland, NP    Family History History reviewed. No pertinent family history.  Social History Social History   Tobacco Use  . Smoking status: Current Every Day Smoker    Packs/day: 0.50  . Smokeless tobacco: Never Used  Substance Use Topics  . Alcohol use: No    Comment: occasionally  . Drug use: Yes    Types: Marijuana     Allergies   Carbamazepine and Prolixin [fluphenazine]   Review of Systems Review of Systems  Constitutional: Negative.   HENT: Negative.   Respiratory: Negative.   Cardiovascular: Negative.   Gastrointestinal:  Negative.   Musculoskeletal: Negative.   Skin: Negative.   Psychiatric/Behavioral: Positive for behavioral problems and hallucinations. Negative for agitation, confusion, decreased concentration, dysphoric mood, self-injury, sleep disturbance and suicidal ideas. The patient is not nervous/anxious and is not hyperactive.   All other systems reviewed and are negative.    Physical Exam Updated Vital Signs BP (!) 133/96 (BP Location: Right Arm)   Pulse 62   Temp 98.1 F (36.7 C) (Oral)   Resp 20   Ht  (1.702 m)   Wt 81.6 kg   SpO2 100%   BMI 28.19 kg/m    Physical Exam Vitals signs and nursing note reviewed.  Constitutional:      General: He is not in acute distress.    Appearance: He is well-developed. He is not ill-appearing, toxic-appearing or diaphoretic.     Comments: Disheveled appearance. Wet and dirty clothing  HENT:     Head: Normocephalic and atraumatic.     Jaw: There is normal jaw occlusion.     Nose: Nose normal.     Mouth/Throat:     Comments: Posterior Oropharynx clear. Eyes:     Extraocular Movements: Extraocular movements intact.     Pupils: Pupils are equal, round, and reactive to light.     Comments: Purulent drainage to the left eye. Normal EOMs.  Conjunctive a without injection, exudate, hemorrhage.  He has no nystagmus.  All 4 visual fields intact.  Patient refusing funduscopic exam.  Neck:     Musculoskeletal: Full passive range of motion without pain, normal range of motion and neck supple.     Trachea: Phonation normal.     Comments: No neck stiffness or neck rigidity. No meningismus. Cardiovascular:     Rate and Rhythm: Normal rate and regular rhythm.     Pulses: Normal pulses.     Heart sounds: Normal heart sounds. No murmur. No friction rub. No gallop.   Pulmonary:     Effort: Pulmonary effort is normal. No respiratory distress.     Breath sounds: Normal breath sounds. No stridor. No wheezing, rhonchi or rales.  Abdominal:     General: Bowel sounds are normal. There is no distension.     Palpations: Abdomen is soft.     Tenderness: There is no abdominal tenderness. There is no guarding or rebound.     Hernia: No hernia is present.  Musculoskeletal: Normal range of motion.     Comments: Moves all 4 extremities without difficulty, tremor or stuttering.  Skin:    General: Skin is warm and dry.  Neurological:     Mental Status: He is alert.     Comments: Ambulatory without difficulty. Ataxic gait. No facial droop. CN 2-12 grossly intact. Normal finger to nose. No hand tremors  Psychiatric:     Comments:  Flat affect. Slowed speech, Withdrawn behavior, Endorses AVH. Denies SI, HI.    ED Treatments / Results  Labs (all labs ordered are listed, but only abnormal results are displayed) Labs Reviewed  COMPREHENSIVE METABOLIC PANEL - Abnormal; Notable for the following components:      Result Value   CO2 21 (*)    All other components within normal limits  RAPID URINE DRUG SCREEN, HOSP PERFORMED - Abnormal; Notable for the following components:   Tetrahydrocannabinol POSITIVE (*)    All other components within normal limits  CBC WITH DIFFERENTIAL/PLATELET - Abnormal; Notable for the following components:   Neutro Abs 7.9 (*)    All other components within normal limits  ACETAMINOPHEN LEVEL - Abnormal; Notable for the following components:   Acetaminophen (Tylenol), Serum <10 (*)    All other components within normal limits  SARS CORONAVIRUS 2 (HOSPITAL ORDER, PERFORMED IN [facility])  ETHANOL  SALICYLATE LEVEL    EKG EKG Interpretation  Date/Time:  Thursday Nov 24 2018 11:24:26 EDT Ventricular Rate:  75 PR Interval:    QRS Duration: 80 QT Interval:  402 QTC Calculation: 449 R Axis:   69 Text Interpretation:  Sinus rhythm Probable left atrial enlargement ST elev, probable normal early repol pattern Baseline wander in lead(s) V2 Confirmed by Benjiman CorePickering, Nathan [phone]([id number]) on 11/24/2018 2:55:59 PM   Radiology No results found.  Procedures Procedures (including critical care time)  Medications Ordered in ED Medications  acetaminophen (TYLENOL) tablet 650 mg (has no administration in time range)  alum & mag hydroxide-simeth (MAALOX/MYLANTA) 200-200-20 MG/5ML suspension 30 mL (has no administration in time range)  nicotine (NICODERM CQ - dosed in mg/24 hours) patch 21 mg (has no administration in time range)   Initial Impression / Assessment and Plan / ED Course  I have reviewed the triage vital signs and the nursing notes.  Pertinent labs & imaging results that were  available during my care of the patient were reviewed by me and considered in my medical decision making (see chart for details).  28 year old here under petiton for IVc via grandmother and police. Afebrile, non septic, non ill appearing. Ambulatory without difficulty. Normal neuro exam. Normal MSK exam. Heart and Lungs clear. Patient is not sure why he is here. Denies SI, HI however endorsees AH stating "they tell me to do things." Has not been taking medications. [phone]  Per police family patient is had aggressive behavior at home.  He has had several of his younger family members.  Family states patient is not taking care of himself, going weeks without bathing or brushing his teeth.  He is not taking any of his psychiatric medications.  Per IVC, family states patient has been hallucinating stating that someone is talking to him telling him to do things.  Per police they have arrested him multiple times over the last 2 weeks for trying to walk out into traffic.  They found patient today walking in the middle of the street in the rain. He was last inpatient at [facility] in [facility]. Patient denies current SI, HI. States he does "hear thing" however will not elaborate on his hallucinations. Denis illicit drug use or alcohol use. Purulent drainage to left eye. EOM intact, No periorbital swelling, erythema. Refused slit lamp, IOP or fluorescein stain.  Lungs clear.  Moves all 4 extremities without difficulty.  He does not appear to have any evidence of NMS, serotonin syndrome.  Will obtain labs, EKG and TTS consult. Patient is calm at this time.  Labs reassuring. COVID neagtive. EKG without ST/T changes. UDS positive for THC. Medically cleared. Awaiting TTS consult at this time.     Final Clinical Impressions(s) / ED Diagnoses   Final diagnoses:  Hallucinations  Acute conjunctivitis of left eye, unspecified acute conjunctivitis type    ED Discharge Orders          Ordered    erythromycin ophthalmic ointment     11/24/18 1422           Advika Mclelland A, PA-C 11/24/18 1636    Benjiman CorePickering, Nathan, MD 11/29/18 1505

## 2018-11-24 NOTE — ED Notes (Signed)
Patient denies pain and is resting comfortably.  

## 2018-11-25 ENCOUNTER — Encounter (HOSPITAL_COMMUNITY): Payer: Self-pay | Admitting: Registered Nurse

## 2018-11-25 DIAGNOSIS — F200 Paranoid schizophrenia: Secondary | ICD-10-CM

## 2018-11-25 DIAGNOSIS — F1210 Cannabis abuse, uncomplicated: Secondary | ICD-10-CM

## 2018-11-25 DIAGNOSIS — R443 Hallucinations, unspecified: Secondary | ICD-10-CM | POA: Insufficient documentation

## 2018-11-25 DIAGNOSIS — R45850 Homicidal ideations: Secondary | ICD-10-CM

## 2018-11-25 NOTE — ED Notes (Addendum)
Pts Mother: [phone]   Pts mother states that the pt was admitted to [facility] an [facility] and was [facility] to the jail. Pts mother states that he was doing very well in [place] and he really needs to go back to [facility] for treatment.

## 2018-11-25 NOTE — ED Notes (Signed)
Breakfast tray ordered 

## 2018-11-25 NOTE — ED Notes (Signed)
Pt lying on stomach in bed,"humping" the bed and moaning.

## 2018-11-25 NOTE — ED Notes (Signed)
Pt is taking shower at this time. 
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
## 2018-11-25 NOTE — ED Notes (Addendum)
Pt presents with a flat affect and mute. He urinated on himself and is not responding to questions. He was repeatedly encouraged to take off urine soiled clothes but refused. After repeated requests pt agreed to shower. He was brought to the shower with soap, towels, and change of clothes. While in the shower the pt refused to wash with soap and water. He removed his soiled scrubs and replaced them with clean ones.

## 2018-11-25 NOTE — ED Notes (Signed)
Pt stated did not receive dinner, no notes or meal charted. Pt given sprite and crackers, and will acquire a sandwich for PM snack @2120 
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
## 2018-11-25 NOTE — ED Notes (Signed)
Lunch tray ordered 
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
## 2018-11-25 NOTE — ED Notes (Signed)
Per first shift report, pt door is to be closed due to pt odor. Pt was bathed on 1st shift. Door window is open and lights are on

## 2018-11-25 NOTE — ED Notes (Signed)
Breakfast ordered 

## 2018-11-26 MED ORDER — ARIPIPRAZOLE 10 MG PO TABS
10.0000 mg | ORAL_TABLET | Freq: Every day | ORAL | Status: DC
  Administered 2018-11-26 – 2018-11-27 (×2): 10 mg via ORAL
  Filled 2018-11-26 (×2): qty 1

## 2018-11-26 MED ORDER — LORAZEPAM 0.5 MG PO TABS
0.5000 mg | ORAL_TABLET | Freq: Once | ORAL | Status: AC
  Administered 2018-11-26: 0.5 mg via ORAL
  Filled 2018-11-26: qty 1

## 2018-11-26 MED ORDER — LORAZEPAM 0.5 MG PO TABS
0.5000 mg | ORAL_TABLET | Freq: Two times a day (BID) | ORAL | Status: DC | PRN
  Administered 2018-11-27: 0.5 mg via ORAL
  Filled 2018-11-26: qty 1

## 2018-11-26 NOTE — ED Notes (Signed)
Snacks placed bedside

## 2018-11-26 NOTE — ED Notes (Signed)
Pt resting in bed.

## 2018-11-26 NOTE — ED Notes (Signed)
Pt dry at this time.

## 2018-11-26 NOTE — ED Notes (Signed)
Sitter prompted toileting for pt. Pt ambulated to bathroom - Sitter encouraged pt to void in toilet. Pt then ambulated back to room.

## 2018-11-26 NOTE — ED Notes (Signed)
Lunch tray ordered 

## 2018-11-26 NOTE — Care Management (Signed)
Patient awaiting inpatient psych placement. 

## 2018-11-26 NOTE — ED Notes (Signed)
Pt noted to be eating snack. Pt took meds after much encouragement. Pt answered yes when RN asked if he can swallow pills OK. Pt then did not answer any further questions - no eye contact noted.

## 2018-11-26 NOTE — BH Assessment (Signed)
[facility] Assessment Progress Note Patient has been accepted to [facility] after 0800 hours. Accepting physician is Shelle Iron MD. Report to be called to [phone]. Any additional information contact [phone].

## 2018-11-26 NOTE — ED Notes (Signed)
bfast ordered 

## 2018-11-26 NOTE — ED Notes (Signed)
Pt refuses tech to take v/s at this time. Will try again in a bit

## 2018-11-26 NOTE — ED Provider Notes (Signed)
Per nursing, patient exhibiting intermittent bizarre behavior.  Behavioral health was contacted by nursing and recommending 0.5 mg Ativan twice daily as needed for anxiety/catatonia.  On my reevaluation, patient is sitting up in bed, diaphoretic, blinking and with a repetitive movement of his right arm, though no obvious distress.  He is responsive during this, states he is "okay" and does not have any requests at this time.  A few seconds later, patient lays down in bed and previous observations have ceased.  He is in no distress.  This is the behavior he has been reported to have been having and which was discussed with behavioral health.  Orders placed.  Patient will continue to be monitored.   Robinson, [place] N, PA-C 11/26/18 1153    Little, Ambrose Finland, MD 11/26/18 [phone]

## 2018-11-26 NOTE — ED Notes (Signed)
Pt was cooperative taking a shower, he help [place] changing is clothes. His bed and room were cleaned

## 2018-11-26 NOTE — Progress Notes (Signed)
Patient meets criteria for inpatient treatment. No appropriate or available beds at [facility]. CSW re-faxed information to the following facilities for review:  [facility]  [facility]  [facility]  [facility]  [facility]  [facility]  [facility]  [facility]  [facility]  [facility]   TTS will continue to seek bed placement.  Vilma Meckel. Algis Greenhouse, MSW, LCSW Clinical Social Work/Disposition Phone: [phone] Fax: [phone]

## 2018-11-26 NOTE — ED Provider Notes (Signed)
Emergency Medicine Observation Re-evaluation Note  Mario [name] is a 28 y.o. male, seen on rounds today.  Pt initially presented to the ED under IVC.  After initial TTS evaluation, he was recommended for discharge, however after contact with patient's family, this decision was changed, patient pending placement.  Currently, the patient is eating breakfast in no apparent distress.  It appears patient has been refusing hygiene responsibilities and urinating on himself.  He has been given showers, however refuses to wash.  Nursing plans for a bed bath this morning.  Psychiatry recommending restart Abilify 10 mg daily.  Physical Exam  BP 124/90 (BP Location: Right Arm)   Pulse (!) 106   Temp 98 F (36.7 C) (Oral)   Resp 18   Ht 5\' 7"  (1.702 m)   Wt 81.6 kg   SpO2 98%   BMI 28.19 kg/m  Physical Exam Vitals signs and nursing note reviewed.  Constitutional:      Appearance: He is well-developed.  HENT:     Head: Normocephalic and atraumatic.  Eyes:     Conjunctiva/sclera: Conjunctivae normal.  Pulmonary:     Effort: Pulmonary effort is normal.  Neurological:     Mental Status: He is alert.  Psychiatric:        Mood and Affect: Mood normal.        Behavior: Behavior normal.     ED Course / MDM  EKG:EKG Interpretation  Date/Time:  Thursday Nov 24 2018 11:24:26 EDT Ventricular Rate:  75 PR Interval:    QRS Duration: 80 QT Interval:  402 QTC Calculation: 449 R Axis:   69 Text Interpretation:  Sinus rhythm Probable left atrial enlargement ST elev, probable normal early repol pattern Baseline wander in lead(s) V2 Confirmed by Benjiman Core [phone]) on 11/24/2018 2:55:59 PM    I have reviewed the labs performed to date as well as medications administered while in observation.  Will begin Abilify 10 mg daily per psych recommendation.   Plan  Current plan is pending placement.  Nursing contacting psychiatry, as patient was previously prescribed multiple psychiatric medications  and per documentation only recommend restart Abilify.  Will clarify. Patient is under full IVC at this time.   , [place] N, PA-C 11/26/18 8099    Clarene Duke Ambrose Finland, MD 11/26/18 [phone]

## 2018-11-26 NOTE — ED Notes (Signed)
Pt assisted in shower by staff x 2. Pt requires prompting to cleanse self and to use the restroom as pt will lie in bed and soil himself.

## 2018-11-26 NOTE — ED Notes (Signed)
Pt eating snack given. 

## 2018-11-26 NOTE — ED Notes (Addendum)
Pt currently resting in bed

## 2018-11-26 NOTE — ED Notes (Signed)
Vernona Rieger [facility] NP recommends to start Ativan 0.5mg  BID prn d/t pt's Catatonic state.

## 2018-11-27 NOTE — ED Provider Notes (Signed)
Emergency Medicine Observation Re-evaluation Note  Mario [name] is a 28 y.o. male, seen on rounds today.  Pt initially presented to the ED for complaints of Schizophrenia Currently, the patient is sleeping. Placement at [facility], to transfer today.  Physical Exam  BP (!) 121/94 (BP Location: Right Arm)   Pulse (!) 109   Temp 97.8 F (36.6 C) (Oral)   Resp 20   Ht 5\' 7"  (1.702 m)   Wt 81.6 kg   SpO2 100%   BMI 28.19 kg/m  Physical Exam Vitals signs and nursing note reviewed.  Constitutional:      Appearance: He is well-developed.     Comments: sleeping  HENT:     Head: Normocephalic and atraumatic.  Eyes:     Conjunctiva/sclera: Conjunctivae normal.  Pulmonary:     Effort: Pulmonary effort is normal.  Psychiatric:        Mood and Affect: Mood normal.        Behavior: Behavior normal.     ED Course / MDM  EKG:EKG Interpretation  Date/Time:  Thursday Nov 24 2018 11:24:26 EDT Ventricular Rate:  75 PR Interval:    QRS Duration: 80 QT Interval:  402 QTC Calculation: 449 R Axis:   69 Text Interpretation:  Sinus rhythm Probable left atrial enlargement ST elev, probable normal early repol pattern Baseline wander in lead(s) V2 Confirmed by Benjiman Core [phone]) on 11/24/2018 2:55:59 PM    I have reviewed the labs performed to date as well as medications administered while in observation.  No recent changes in the last 24 hours. Plan  Current plan is for transfer to [facility] for inpatient treatment. Patient is under full IVC at this time.   Robinson, [place] N, PA-C 11/27/18 2458    Maia Plan, MD 11/28/18 [phone]

## 2018-11-27 NOTE — ED Notes (Signed)
Breakfast tray ordered 

## 2018-11-27 NOTE — ED Notes (Signed)
Called [facility] to let them know pt is being transported to their facility via [employer].

## 2018-11-27 NOTE — ED Notes (Signed)
Ordered pt's lunch

## 2019-02-27 ENCOUNTER — Other Ambulatory Visit: Payer: Self-pay

## 2019-02-27 ENCOUNTER — Emergency Department (HOSPITAL_COMMUNITY)
Admission: EM | Admit: 2019-02-27 | Discharge: 2019-03-06 | Disposition: A | Discharge status: Home / Self Care | Payer: Medicaid Other | Attending: Emergency Medicine | Admitting: Emergency Medicine

## 2019-02-27 DIAGNOSIS — R443 Hallucinations, unspecified: Secondary | ICD-10-CM | POA: Diagnosis present

## 2019-02-27 DIAGNOSIS — Z046 Encounter for general psychiatric examination, requested by authority: Secondary | ICD-10-CM

## 2019-02-27 DIAGNOSIS — T148XXA Other injury of unspecified body region, initial encounter: Secondary | ICD-10-CM | POA: Diagnosis present

## 2019-02-27 DIAGNOSIS — F17210 Nicotine dependence, cigarettes, uncomplicated: Secondary | ICD-10-CM | POA: Insufficient documentation

## 2019-02-27 DIAGNOSIS — Z20828 Contact with and (suspected) exposure to other viral communicable diseases: Secondary | ICD-10-CM | POA: Insufficient documentation

## 2019-02-27 DIAGNOSIS — Z79899 Other long term (current) drug therapy: Secondary | ICD-10-CM | POA: Insufficient documentation

## 2019-02-27 DIAGNOSIS — R45851 Suicidal ideations: Secondary | ICD-10-CM | POA: Insufficient documentation

## 2019-02-27 DIAGNOSIS — R4689 Other symptoms and signs involving appearance and behavior: Secondary | ICD-10-CM | POA: Diagnosis present

## 2019-02-27 DIAGNOSIS — F209 Schizophrenia, unspecified: Secondary | ICD-10-CM | POA: Insufficient documentation

## 2019-02-27 DIAGNOSIS — F29 Unspecified psychosis not due to a substance or known physiological condition: Secondary | ICD-10-CM | POA: Diagnosis present

## 2019-02-27 LAB — CBC
HCT: 46.1 % (ref 39.0–52.0)
Hemoglobin: 14.3 g/dL (ref 13.0–17.0)
MCH: 27.7 pg (ref 26.0–34.0)
MCHC: 31 g/dL (ref 30.0–36.0)
MCV: 89.3 fL (ref 80.0–100.0)
Platelets: 268 10*3/uL (ref 150–400)
RBC: 5.16 MIL/uL (ref 4.22–5.81)
RDW: 12.8 % (ref 11.5–15.5)
WBC: 9.7 10*3/uL (ref 4.0–10.5)
nRBC: 0 % (ref 0.0–0.2)

## 2019-02-27 LAB — COMPREHENSIVE METABOLIC PANEL
ALT: 21 U/L (ref 0–44)
AST: 24 U/L (ref 15–41)
Albumin: 4.4 g/dL (ref 3.5–5.0)
Alkaline Phosphatase: 57 U/L (ref 38–126)
Anion gap: 13 (ref 5–15)
BUN: 12 mg/dL (ref 6–20)
CO2: 21 mmol/L — ABNORMAL LOW (ref 22–32)
Calcium: 9.8 mg/dL (ref 8.9–10.3)
Chloride: 105 mmol/L (ref 98–111)
Creatinine, Ser: 1.01 mg/dL (ref 0.61–1.24)
GFR calc Af Amer: >60 mL/min (ref >60)
GFR calc non Af Amer: >60 mL/min (ref >60)
Glucose, Bld: 118 mg/dL — ABNORMAL HIGH (ref 70–99)
Potassium: 3.6 mmol/L (ref 3.5–5.1)
Sodium: 139 mmol/L (ref 135–145)
Total Bilirubin: 0.5 mg/dL (ref 0.3–1.2)
Total Protein: 7.7 g/dL (ref 6.5–8.1)

## 2019-02-27 LAB — RAPID URINE DRUG SCREEN, HOSP PERFORMED
Amphetamines: NOT DETECTED
Barbiturates: NOT DETECTED
Benzodiazepines: NOT DETECTED
Cocaine: NOT DETECTED
Opiates: NOT DETECTED
Tetrahydrocannabinol: POSITIVE — AB

## 2019-02-27 LAB — SALICYLATE LEVEL: Salicylate Lvl: <7 mg/dL (ref 2.8–30.0)

## 2019-02-27 LAB — ACETAMINOPHEN LEVEL: Acetaminophen (Tylenol), Serum: <10 ug/mL — ABNORMAL LOW (ref 10–30)

## 2019-02-27 LAB — ETHANOL: Alcohol, Ethyl (B): <10 mg/dL (ref <10)

## 2019-02-27 NOTE — ED Triage Notes (Signed)
Pt BIB [facility], IVC by family for decreased self-care, saying he was going to kill himself, and increased aggression. Per [facility] pt was cooperative and non-aggressive but was seen occasionally balling fists. Displaying same behaviors in triage when asked to dress out but eventually allowing staff to change him into scrubs. Nonverbal at this time

## 2019-02-27 NOTE — ED Notes (Signed)
Pt changed into purple scrubs witnessed by this NT and Clifton James, NT. Pt remained in his own underwear, otherwise changed into hospital clothing. Belongings inventoried and placed in purple zone locker #3. Valuables given to security.

## 2019-02-27 NOTE — ED Notes (Signed)
Pt in green hallway so was unable to get vitals updated

## 2019-02-28 ENCOUNTER — Other Ambulatory Visit: Payer: Self-pay

## 2019-02-28 LAB — SARS CORONAVIRUS 2 (TAT 6-24 HRS): SARS Coronavirus 2: NEGATIVE

## 2019-02-28 MED ORDER — OMEGA-3 500 MG PO CAPS: 500.0000 mg | ORAL_CAPSULE | Freq: Two times a day (BID) | ORAL | Status: DC

## 2019-02-28 MED ORDER — NIACIN 500 MG PO TABS
500.0000 mg | ORAL_TABLET | Freq: Every day | ORAL | Status: DC
  Administered 2019-02-28 – 2019-03-05 (×6): 500 mg via ORAL
  Filled 2019-02-28 (×8): qty 1

## 2019-02-28 MED ORDER — DIPHENHYDRAMINE HCL 25 MG PO CAPS: 50.0000 mg | ORAL_CAPSULE | Freq: Four times a day (QID) | ORAL | Status: DC | PRN

## 2019-02-28 MED ORDER — HYDROXYZINE HCL 25 MG PO TABS
25.0000 mg | ORAL_TABLET | Freq: Every evening | ORAL | Status: DC | PRN
  Administered 2019-03-02 – 2019-03-04 (×2): 25 mg via ORAL
  Filled 2019-02-28 (×2): qty 1

## 2019-02-28 MED ORDER — OMEGA-3-ACID ETHYL ESTERS 1 G PO CAPS
1.0000 g | ORAL_CAPSULE | Freq: Every day | ORAL | Status: DC
  Administered 2019-02-28 – 2019-03-05 (×5): 1 g via ORAL
  Filled 2019-02-28 (×6): qty 1

## 2019-02-28 MED ORDER — OLANZAPINE 5 MG PO TBDP
10.0000 mg | ORAL_TABLET | Freq: Once | ORAL | Status: AC
  Administered 2019-02-28: 13:00:00 10 mg via ORAL
  Filled 2019-02-28: qty 2

## 2019-02-28 MED ORDER — OLANZAPINE 10 MG PO TABS
10.0000 mg | ORAL_TABLET | Freq: Every day | ORAL | Status: DC
  Administered 2019-02-28 – 2019-03-05 (×6): 10 mg via ORAL
  Filled 2019-02-28 (×2): qty 1
  Filled 2019-02-28: qty 2
  Filled 2019-02-28 (×2): qty 1
  Filled 2019-02-28 (×3): qty 2
  Filled 2019-02-28 (×3): qty 1

## 2019-02-28 NOTE — ED Notes (Addendum)
Pt arrived to [facility] - ambulatory. Wearing burgundy scrubs and eyeglasses. Sitter w/pt.

## 2019-02-28 NOTE — BH Assessment (Signed)
Per Lindon Romp, FNP, the patient meets criteria for inpatient hospitalization. There are currently no beds available at [facility]   Disposition CSW referred the patient to the following facilities for possible placement:  [facility]  Disposition CSW and TTS will follow and continue to seek possible placement  Radonna Ricker, MSW, [place] Worker [facility]  Phone: [phone] .
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
## 2019-02-28 NOTE — ED Provider Notes (Signed)
[facility] EMERGENCY DEPARTMENT Provider Note   CSN: [id number] Arrival date & time: 02/27/19  1805     History   Chief Complaint No chief complaint on file.   HPI Mario [name] is a 28 y.o. male.     The history is provided by the patient and medical records.    Level 5 caveat: Psychiatric condition  28 y.o. M with history of bipolar disorder, schizophrenia, presenting to the ED under IVC petitioned by his mother.  Reportedly patient has not been taking care of himself, tending to his own hygiene (has been lying in bed with feces and urine), or taking his medications like he should.  He has walked out into traffic multiple times stating he wanted to kill himself.  He has been aggressive with family-- verbal and physical.  When questioned, patient remains quiet, batting his eyes and making fists with his hands.  His only response is "I need to use the bathroom".  Past Medical History:  Diagnosis Date  . Bipolar 1 disorder ([place])   . Schizophrenia [facility])     Patient Active Problem List   Diagnosis Date Noted  . Hallucinations   . Aggression   . Multiple wounds of skin 03/26/2014  . Episodic mood disorder ([place]) 02/16/2013  . Cannabis abuse 02/14/2013  . Psychotic disorder ([place]) 02/12/2013    No past surgical history on file.      Home Medications    Prior to Admission medications   Medication Sig Start Date End Date Taking? Authorizing Provider  ARIPiprazole (ABILIFY) 10 MG tablet Take 1 tablet (10 mg total) by mouth daily. Patient not taking: Reported on 11/25/2018 06/07/14 11/25/18  Rankin, Shuvon B, NP  benztropine (COGENTIN) 0.5 MG tablet Take 1 tablet (0.5 mg total) by mouth daily. Patient not taking: Reported on 11/25/2018 06/07/14   Rankin, Shuvon B, NP  buPROPion (WELLBUTRIN XL) 150 MG 24 hr tablet Take 1 tablet (150 mg total) by mouth daily. Patient not taking: Reported on 11/25/2018 06/07/14   Rankin, Shuvon B, NP  Cholecalciferol (VITAMIN  D3) 250 MCG (10000 UT) TABS Take 10,000 Units by mouth daily.     [provider]  diphenhydrAMINE (BENADRYL) 25 MG tablet Take 1 tablet (25 mg total) by mouth every 6 (six) hours as needed for itching or allergies (Rash). Patient not taking: Reported on 11/25/2018 03/15/14   Baron Sane, PA-C  erythromycin ophthalmic ointment Place a 1/2 inch ribbon of ointment into the lower eyelid. 11/24/18   Henderly, Britni A, PA-C  haloperidol (HALDOL) 20 MG tablet Take 20 mg by mouth at bedtime.    [provider]  hydrOXYzine (ATARAX/VISTARIL) 25 MG tablet Take 1 tablet (25 mg total) by mouth at bedtime as needed (sleep). Patient not taking: Reported on 11/25/2018 02/17/13   Niel Hummer, NP  Javier Docker Oil (OMEGA-3) 500 MG CAPS Take 500 mg by mouth 2 (two) times a day.    [provider]  niacin 500 MG tablet Take 500 mg by mouth at bedtime.    [provider]  OLANZapine (ZYPREXA) 10 MG tablet Take 10 mg by mouth daily.    [provider]  pyridOXINE (VITAMIN B-6) 50 MG tablet Take 100 mg by mouth 2 (two) times a day.    [provider]  traZODone (DESYREL) 50 MG tablet Take 1 tablet (50 mg total) by mouth at bedtime as needed for sleep. Patient not taking: Reported on 11/25/2018 03/30/14   Niel Hummer,  NP  vitamin C (ASCORBIC ACID) 500 MG tablet Take 1,000 mg by mouth 2 (two) times daily.    [provider]    Family History No family history on file.  Social History Social History   Tobacco Use  . Smoking status: Current Every Day Smoker    Packs/day: 0.50  . Smokeless tobacco: Never Used  Substance Use Topics  . Alcohol use: No    Comment: occasionally  . Drug use: Yes    Types: Marijuana     Allergies   Carbamazepine and Prolixin [fluphenazine]   Review of Systems Review of Systems  Unable to perform ROS: Psychiatric disorder     Physical Exam Updated Vital Signs BP 138/89 (BP Location: Left Arm)   Pulse 100    Temp 98.6 F (37 C) (Oral)   Resp 16   SpO2 100%   Physical Exam Vitals signs and nursing note reviewed.  Constitutional:      Appearance: He is well-developed.  HENT:     Head: Normocephalic and atraumatic.  Eyes:     Conjunctiva/sclera: Conjunctivae normal.     Pupils: Pupils are equal, round, and reactive to light.  Neck:     Musculoskeletal: Normal range of motion.  Cardiovascular:     Rate and Rhythm: Normal rate and regular rhythm.     Heart sounds: Normal heart sounds.  Pulmonary:     Effort: Pulmonary effort is normal.     Breath sounds: Normal breath sounds.  Abdominal:     General: Bowel sounds are normal.     Palpations: Abdomen is soft.  Musculoskeletal: Normal range of motion.  Skin:    General: Skin is warm and dry.  Neurological:     Mental Status: He is alert and oriented to person, place, and time.  Psychiatric:     Comments: Will not answer questions directly when asked, seems withdrawn and staring at the floor, he is clenching fists during exam but is not aggressive towards examiner      ED Treatments / Results  Labs (all labs ordered are listed, but only abnormal results are displayed) Labs Reviewed  COMPREHENSIVE METABOLIC PANEL - Abnormal; Notable for the following components:      Result Value   CO2 21 (*)    Glucose, Bld 118 (*)    All other components within normal limits  ACETAMINOPHEN LEVEL - Abnormal; Notable for the following components:   Acetaminophen (Tylenol), Serum <10 (*)    All other components within normal limits  RAPID URINE DRUG SCREEN, HOSP PERFORMED - Abnormal; Notable for the following components:   Tetrahydrocannabinol POSITIVE (*)    All other components within normal limits  ETHANOL  SALICYLATE LEVEL  CBC    EKG None  Radiology No results found.  Procedures Procedures (including critical care time)  Medications Ordered in ED Medications - No data to display   Initial Impression / Assessment and Plan  / ED Course  I have reviewed the triage vital signs and the nursing notes.  Pertinent labs & imaging results that were available during my care of the patient were reviewed by me and considered in my medical decision making (see chart for details).  28 year old male here under IVC petition by his mother due to erratic behavior at home.  Patient has history of bipolar disorder as well as schizophrenia, has not been taking care of himself.  He has been found lying in bed in his own urine and feces, refusing to bathe, or  care for himself.  He is also been running into the road and telling family that he wanted to die.  Here, he is not really answering my questions, he is batting his eyes and making fist with his hands but is not aggressive or hostile towards me.  The only thing he says to me is "I need to go to the bathroom".  His labs from triage are overall reassuring, UDS is positive for THC.  Medically clear.  TTS has evaluated, recommends inpatient treatment.  No appropriate beds available currently so we will hold in ED pending placement.  COVID swab will be sent.  Final Clinical Impressions(s) / ED Diagnoses   Final diagnoses:  Involuntary commitment    ED Discharge Orders    None       Garlon HatchetSanders, Phylis Javed M, PA-C 02/28/19 0529    Glynn Octaveancour, Stephen, MD 02/28/19 [phone]

## 2019-02-28 NOTE — ED Notes (Signed)
Pt given Diet Sprite as requested. Pt voiced understanding that sodas will be given w/meals and at snack time only.

## 2019-02-28 NOTE — BH Assessment (Signed)
[facility] Assessment Progress Note    Benton Tooker, mother, [phone], called.  She states that patient is his own guardian and refuses to do what he needs to do to stay mentally stable.  She states that he gets his injections, but is not compliant with his home meds.  She states that he does not have Medicaid and does not have [employer].  She states that patient is defiant and will not apply for assistance.  She states that she has been trying to figure out how to get guardianship over him.  She states that patient has been constantly pacing, walking out in the street and has been urinating on himself.  She states that she has tried to use leverage of him being compliant with his medications in order to live in her home, but she states that does not phase him and he will just sleep in the grass or on the sidewalk. She states that he needs hospitalization.

## 2019-02-28 NOTE — ED Notes (Signed)
EDP to order pt's medical meds and Farris Has, [facility] NP, advised will review pt's chart and order pt's psych meds as pt has not been compliant w/taking meds for "a while".

## 2019-02-28 NOTE — ED Notes (Signed)
Lunch Tray Ordered @ 1010. 

## 2019-03-01 NOTE — ED Notes (Signed)
This RN asked pt if we could clean his room and have him take a shower at 0900 this morning; pt refused and did not acknowledge RN. Later, after lunch 1 [employer] officers and Animal nutritionist also attempted to ask pt to shower. Pt still refused. Pt laying in bed saturated in urine.

## 2019-03-01 NOTE — ED Notes (Signed)
Regular Diet was ordered for Lunch. 

## 2019-03-01 NOTE — ED Notes (Signed)
Patient was Given Snacks and Drink. 

## 2019-03-01 NOTE — ED Notes (Signed)
Pt obliged in hygiene care and showered. Linens are changed and snacks provided. Pt denied pain or discomfort when asked.

## 2019-03-01 NOTE — ED Notes (Signed)
Breakfast Ordered 

## 2019-03-02 LAB — BASIC METABOLIC PANEL
Anion gap: 11 (ref 5–15)
BUN: 8 mg/dL (ref 6–20)
CO2: 25 mmol/L (ref 22–32)
Calcium: 9.3 mg/dL (ref 8.9–10.3)
Chloride: 105 mmol/L (ref 98–111)
Creatinine, Ser: 1.13 mg/dL (ref 0.61–1.24)
GFR calc Af Amer: >60 mL/min (ref >60)
GFR calc non Af Amer: >60 mL/min (ref >60)
Glucose, Bld: 91 mg/dL (ref 70–99)
Potassium: 4.2 mmol/L (ref 3.5–5.1)
Sodium: 141 mmol/L (ref 135–145)

## 2019-03-02 LAB — CBG MONITORING, ED: Glucose-Capillary: 86 mg/dL (ref 70–99)

## 2019-03-02 NOTE — ED Notes (Signed)
EDP [place] at bedside 

## 2019-03-02 NOTE — ED Notes (Signed)
Approx. 15 mins ago, pt was found in bed, spontaneous chest rise, pt non responding. Pt found visibly soiled in bed, staring into space. Unable to redirect pt w/ assistance of multiple staff. Pt refusing care and assistance from staff.

## 2019-03-02 NOTE — ED Provider Notes (Signed)
  Physical Exam  BP 121/70 (BP Location: Right Arm)   Pulse 66   Temp 98 F (36.7 C) (Oral)   Resp 16   SpO2 100%   Physical Exam  ED Course/Procedures     Procedures  MDM  Patient has reassuring BMP.  EKG reassuring.  No seizure history.  This was not necessarily a seizure.  I think patient remains medically cleared.       Davonna Belling, MD 03/02/19 1325

## 2019-03-02 NOTE — ED Notes (Signed)
Regular Diet was ordered for Lunch. 

## 2019-03-02 NOTE — ED Provider Notes (Signed)
Asked by nursing to see patient as he had an episode of posturing, incontinence and slow return to normal. Still with right eye twitching.  On my excam he is standing and has no complains. Mouth appears dry still with involuntary blinking on right. Also with strabismus and otherwise neuro intact.  Review of meds without antiepileptics. Is on sedatives but states he has gotten those like he was supposed to.  Will check glucose and bmp. Continue to eval. For possible seizure. If more episodes may need neuro consuylt.    Mario [name], Corene Cornea, MD 03/02/19 [phone]

## 2019-03-02 NOTE — ED Notes (Signed)
Pt eating lunch at bedside.

## 2019-03-02 NOTE — ED Notes (Signed)
Patient was given Snack and Drink. 

## 2019-03-02 NOTE — ED Notes (Signed)
Pt currently being assessed by [facility].  

## 2019-03-02 NOTE — Progress Notes (Signed)
Referral information has been refaxed to the following hospitals For review:  [facility]   [facility]  [facility]  [place]  [facility]  [place]      Disposition will continue to assist with inpatient placement needs.   Audree Camel, LCSW, [place] Disposition [facility]/TTS [phone]

## 2019-03-02 NOTE — ED Notes (Addendum)
Sitter went to get patient's morning vitals and informed RN that something was wrong with patient; RN noticed patient was posturing and not responding to staff; after a few minutes patient started to come too but was very agitated and slightly aggressive; pt was grabbing covers and would not allow staff to change soiled linen; Pt had incontinent episode; After patient came too he started scream "I'm fine" when asked what he remembers he states "I was getting up"; Pt's right eye was twitching at this time and patient seem to be in post ital state; RN went to get EDP to evaluate patient as 2nd RN attempted to IV started on patient; Pt refused IV and started yanking at linen; Sitter was able to get patient clean up and pt walked to the bathroom; When EDP arrived patient was back to baseline and lying down in bed-Monique,RN

## 2019-03-02 NOTE — [facility] Counselor (Signed)
TTS reassessment: Patient is laying in bed and requires verbal direction from RN and myself to sit up in bed. Per chart review, patient displayed bizarre behavior and potentially experienced seizure last night. Patient is slow to answer assessment questions and when he does he gives short responses. He appears to be thought blocking. Patient states that he is ready to leave the hospital but does admit to not always taking his medications.  Per patient's mother, Spero Curb. Patient is non-compliant with oral medications. He is not tending to hygiene, and urinating on himself. Patient will sleep in the grass or on the side walk. He is reluctant to apply for Medicaid and mental health services.  Patient continues to meet in patient criteria.

## 2019-03-03 NOTE — ED Notes (Signed)
Pt's dinner ordered °

## 2019-03-03 NOTE — ED Notes (Signed)
Attempted to get pt.s vitals, pt. Said "They already did it". When asked again, he would wave his arms around and say "No." RN notified. Will continue to monitor, [place] NT
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
## 2019-03-03 NOTE — ED Notes (Signed)
Pt.s breakfast has arrived. 

## 2019-03-03 NOTE — ED Notes (Signed)
Pt. Spoke to TTS. 

## 2019-03-03 NOTE — ED Notes (Signed)
Very strong odor of urine/feces in pt's room with visibly soiled sheets. Has repeatedly refused/ignored this RN's attempts to get him to allow [place] to change his sheets/get him cleaned up. Animal nutritionist he has good rapport with is at bedside speaking with pt.

## 2019-03-03 NOTE — ED Notes (Signed)
Pt. Given 2 chocolate chip cookies as well as a sprite.

## 2019-03-03 NOTE — ED Notes (Signed)
Pt. Given a urinal in hope that urinating on themselves may be avoided. Will continue to monitor, [place] NT

## 2019-03-03 NOTE — ED Notes (Signed)
Breakfast tray ordered 

## 2019-03-03 NOTE — ED Notes (Signed)
Asked pt. to take a shower. They have voided onto themselves. Pt. Grunted and said "no". Will ask again. Sharyn Lull, [place].

## 2019-03-03 NOTE — ED Notes (Signed)
Pt.s lunch has arrived.  

## 2019-03-03 NOTE — ED Notes (Addendum)
Pt. Took a shower. After many attempts of asking, security guard spoke to pt. And pt. Decided to take a shower.

## 2019-03-03 NOTE — ED Notes (Signed)
Pt's dinner has arrived.

## 2019-03-03 NOTE — ED Notes (Addendum)
Pt noted to be restless in bed with exaggerated movements. Went in to assess pt and noted that while pt did not verbally respond to this RN he decreased movements when being spoken to by security and displayed voluntary movements (fist bump). Pt's eye was twitching but no seizure like activity was noted before or after.

## 2019-03-03 NOTE — ED Notes (Signed)
Pt. Asked for sprite, given to pt with his lunch.

## 2019-03-03 NOTE — ED Notes (Signed)
Staff attempted to get urine but patient walked to bathroom and refused urinate in hat-Monique,RN

## 2019-03-03 NOTE — ED Notes (Signed)
Patient had incontinent episode; RN noticed strong urine smell; EDP notified for Urinalysis test to be done; Patient ambulated to bathroom and had sheets and scrubs changed-Monique,RN

## 2019-03-04 MED ORDER — LORAZEPAM 1 MG PO TABS
2.0000 mg | ORAL_TABLET | Freq: Four times a day (QID) | ORAL | Status: DC | PRN
  Administered 2019-03-05: 2 mg via ORAL
  Filled 2019-03-04: qty 2

## 2019-03-04 MED ORDER — LORAZEPAM 2 MG/ML IJ SOLN: 2.0000 mg | Freq: Four times a day (QID) | INTRAMUSCULAR | Status: DC | PRN

## 2019-03-04 MED ORDER — HALOPERIDOL LACTATE 5 MG/ML IJ SOLN: 5.0000 mg | Freq: Four times a day (QID) | INTRAMUSCULAR | Status: DC | PRN

## 2019-03-04 MED ORDER — DIPHENHYDRAMINE HCL 25 MG PO CAPS
50.0000 mg | ORAL_CAPSULE | Freq: Four times a day (QID) | ORAL | Status: DC | PRN
  Administered 2019-03-05: 09:00:00 25 mg via ORAL
  Filled 2019-03-04: qty 2

## 2019-03-04 MED ORDER — DIPHENHYDRAMINE HCL 50 MG/ML IJ SOLN: 50.0000 mg | Freq: Four times a day (QID) | INTRAMUSCULAR | Status: DC | PRN

## 2019-03-04 MED ORDER — HALOPERIDOL 5 MG PO TABS
5.0000 mg | ORAL_TABLET | Freq: Four times a day (QID) | ORAL | Status: DC | PRN
  Administered 2019-03-05: 09:00:00 5 mg via ORAL
  Filled 2019-03-04: qty 1

## 2019-03-04 NOTE — ED Notes (Signed)
Windell Moment, PA, assessed pt - advised continue to attempt to obtain urine specimen but no need to force or perform in and out cath at this time.

## 2019-03-04 NOTE — ED Notes (Signed)
Breakfast ordered 

## 2019-03-04 NOTE — ED Notes (Signed)
Pt initially attempting to refuse VS to be taken. Pt cooperated after given Sprite and much encouragement.

## 2019-03-04 NOTE — ED Notes (Signed)
Pt noted w/foul urine odor. Pt was incont of urine this am. Pt assisted w/cleansing by staff and bed linen changed. Pt refused to allow male RN to assist this RN w/assessing his perineum d/t odor. Pt refusing to give urine specimen. Windell Moment, PA, aware.

## 2019-03-04 NOTE — BH Assessment (Signed)
[place] Assessment Progress Note This Probation officer spoke with patient this date to evaluate current mental health status. Patient continues to be disorganized and did not seem to process the content of this writer's questions at times. Case was staffed with tate NP, Money NP who recommended patient continue to be monitored inpatient.

## 2019-03-05 NOTE — ED Notes (Signed)
Pt continues to refuse to provide urine specimen  

## 2019-03-05 NOTE — Progress Notes (Signed)
CSW contacted referral facilities with the following results:  Still reviewing: [facility] (voicemail left) [facility] (no answer) [facility] (no answer) [facility]  Declined: [facility] (no insurance) [facility] (at capacity) [facility] (at capacity) [facility] (at capacity for adults) [facility] (closed due to [employer], may reopen tomorrow) [facility] (waitlist) [facility] (behavioral acuity) [facility] (at capacity)  TTS will continue to seek bed placement.   Chalmers Guest. Guerry Bruin, MSW, [place] Work/Disposition Phone: [phone] Fax: [phone]

## 2019-03-05 NOTE — ED Notes (Signed)
Staffing Office and Agricultural consultant aware no Actuary available for pt - being monitored by staff.

## 2019-03-05 NOTE — ED Notes (Signed)
Pt still asleep at this time  

## 2019-03-05 NOTE — BHH Counselor (Signed)
Re-assessment:   Patient re-assessed this a.m. Patient oriented to time and place. Report he's in the hospital due to not taking his medication as prescribed. Patient able to process (slowly) and thoughts seemed clear. Report he rents a room and has family support from his mother and father. Patient denied suicidal / homicidal ideations, denied visual / auditory hallucinations.   Shuvon Rankin, NP, continues to recommend inpatient treatment

## 2019-03-05 NOTE — ED Provider Notes (Signed)
Emergency Medicine Observation Re-evaluation Note  Mario [name] is a 28 y.o. male, seen on rounds today.  Pt initially presented to the ED for complaints of No chief complaint on file. Currently, the patient is awake, resting comfortably in bed..  Physical Exam  BP (!) 132/97 (BP Location: Left Arm)   Pulse 98   Temp 98.1 F (36.7 C) (Oral)   Resp 17   SpO2 100%  Physical Exam Vitals signs and nursing note reviewed.  Constitutional:      General: He is not in acute distress.    Appearance: Normal appearance.  Neck:     Musculoskeletal: Normal range of motion and neck supple.  Cardiovascular:     Rate and Rhythm: Normal rate.  Neurological:     Mental Status: He is alert.     ED Course / MDM  EKG:EKG Interpretation  Date/Time:  Thursday March 02 2019 07:37:25 EDT Ventricular Rate:  66 PR Interval:  148 QRS Duration: 84 QT Interval:  394 QTC Calculation: 413 R Axis:   77 Text Interpretation:  Normal sinus rhythm Early repolarization no significant change since Feb 28 2019 Confirmed by Sherwood Gambler [phone]) on 03/02/2019 7:53:38 AM Also confirmed by Davonna Belling [phone])  on 03/02/2019 7:55:15 AM    I have reviewed the labs performed to date as well as medications administered while in observation.  Recent changes in the last 24 hours include he continues to have foul-smelling urine and noncompliant with giving a urine sample.  Per RN he continues to urinate on himself occasionally, will not urinate into hat for urine sample. Plan  Current plan is for inpatient treatment. Patient is under full IVC at this time.   Lorin Glass, PA-C 03/05/19 0900    Deno Etienne, DO 03/05/19 1218

## 2019-03-05 NOTE — ED Notes (Signed)
Re-TTS completed.  

## 2019-03-06 DIAGNOSIS — F29 Unspecified psychosis not due to a substance or known physiological condition: Secondary | ICD-10-CM

## 2019-03-06 MED ORDER — HALOPERIDOL DECANOATE 100 MG/ML IM SOLN
100.0000 mg | Freq: Once | INTRAMUSCULAR | Status: AC
  Administered 2019-03-06: 14:00:00 100 mg via INTRAMUSCULAR
  Filled 2019-03-06: qty 1
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
## 2019-03-06 NOTE — ED Provider Notes (Signed)
Nursing reports that Mario [name] requested he be discharged as he is now psychiatrically cleared.  They recommended a Haldol injection which was ordered.  Patient will follow-up as an outpatient.  Patient did not complain of any further SI, HI, or other complaints and BH requested resection of his IVC.  This was performed.  Patient will be discharged and will return if symptoms change or worsen.    Clinical Impression: 1. Involuntary commitment     Disposition: Discharge  Condition: Good  I have discussed the results, Dx and Tx plan with the pt(& family if present). He/she/they expressed understanding and agree(s) with the plan. Discharge instructions discussed at great length. Strict return precautions discussed and pt &/or family have verbalized understanding of the instructions. No further questions at time of discharge.    New Prescriptions   No medications on file    Follow Up: No follow-up provider specified.      Tegeler, Gwenyth Allegra, MD 03/06/19 [phone]

## 2019-03-06 NOTE — BH Assessment (Signed)
[place] Assessment Progress Note Lewis NP evaluated patient this date and recommended patient be discharged.

## 2019-03-06 NOTE — ED Notes (Signed)
Pt's mother called Chief of Staff asking for information about pt. This RN asked pt if mother could be updated, he said no at this time, and therefore mother was not provided with an update.

## 2019-05-04 ENCOUNTER — Emergency Department (HOSPITAL_COMMUNITY): Payer: No Typology Code available for payment source

## 2019-05-04 ENCOUNTER — Emergency Department (HOSPITAL_COMMUNITY)
Admission: EM | Admit: 2019-05-04 | Discharge: 2019-05-04 | Disposition: A | Discharge status: Home / Self Care | Payer: No Typology Code available for payment source | Attending: Emergency Medicine | Admitting: Emergency Medicine

## 2019-05-04 ENCOUNTER — Other Ambulatory Visit: Payer: Self-pay

## 2019-05-04 ENCOUNTER — Encounter (HOSPITAL_COMMUNITY): Payer: Self-pay | Admitting: Emergency Medicine

## 2019-05-04 DIAGNOSIS — Y939 Activity, unspecified: Secondary | ICD-10-CM | POA: Insufficient documentation

## 2019-05-04 DIAGNOSIS — Z79899 Other long term (current) drug therapy: Secondary | ICD-10-CM | POA: Insufficient documentation

## 2019-05-04 DIAGNOSIS — Y929 Unspecified place or not applicable: Secondary | ICD-10-CM | POA: Diagnosis not present

## 2019-05-04 DIAGNOSIS — Y999 Unspecified external cause status: Secondary | ICD-10-CM | POA: Insufficient documentation

## 2019-05-04 DIAGNOSIS — F17200 Nicotine dependence, unspecified, uncomplicated: Secondary | ICD-10-CM | POA: Diagnosis not present

## 2019-05-04 DIAGNOSIS — S52125A Nondisplaced fracture of head of left radius, initial encounter for closed fracture: Secondary | ICD-10-CM | POA: Diagnosis not present

## 2019-05-04 DIAGNOSIS — S59902A Unspecified injury of left elbow, initial encounter: Secondary | ICD-10-CM | POA: Diagnosis present
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
## 2019-05-04 NOTE — Discharge Instructions (Signed)
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
## 2019-05-04 NOTE — ED Notes (Signed)
Patient transported to X-ray 

## 2019-05-27 ENCOUNTER — Other Ambulatory Visit: Payer: Self-pay

## 2019-05-27 ENCOUNTER — Encounter (HOSPITAL_COMMUNITY): Payer: Self-pay

## 2019-05-27 ENCOUNTER — Emergency Department (HOSPITAL_COMMUNITY)
Admission: EM | Admit: 2019-05-27 | Discharge: 2019-05-30 | Disposition: A | Discharge status: Other Acute Inpatient Hospital | Payer: Self-pay | Attending: Emergency Medicine | Admitting: Emergency Medicine

## 2019-05-27 DIAGNOSIS — Z79899 Other long term (current) drug therapy: Secondary | ICD-10-CM | POA: Insufficient documentation

## 2019-05-27 DIAGNOSIS — F200 Paranoid schizophrenia: Secondary | ICD-10-CM | POA: Diagnosis present

## 2019-05-27 DIAGNOSIS — F209 Schizophrenia, unspecified: Secondary | ICD-10-CM | POA: Insufficient documentation

## 2019-05-27 DIAGNOSIS — Z139 Encounter for screening, unspecified: Secondary | ICD-10-CM

## 2019-05-27 DIAGNOSIS — Z0489 Encounter for examination and observation for other specified reasons: Secondary | ICD-10-CM | POA: Insufficient documentation

## 2019-05-27 DIAGNOSIS — F17210 Nicotine dependence, cigarettes, uncomplicated: Secondary | ICD-10-CM | POA: Insufficient documentation

## 2019-05-27 DIAGNOSIS — Z20828 Contact with and (suspected) exposure to other viral communicable diseases: Secondary | ICD-10-CM | POA: Insufficient documentation
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
## 2019-05-28 DIAGNOSIS — F319 Bipolar disorder, unspecified: Secondary | ICD-10-CM

## 2019-05-28 DIAGNOSIS — F200 Paranoid schizophrenia: Secondary | ICD-10-CM

## 2019-05-28 DIAGNOSIS — Z9114 Patient's other noncompliance with medication regimen: Secondary | ICD-10-CM

## 2019-05-28 DIAGNOSIS — R45851 Suicidal ideations: Secondary | ICD-10-CM

## 2019-05-28 DIAGNOSIS — F17210 Nicotine dependence, cigarettes, uncomplicated: Secondary | ICD-10-CM

## 2019-05-28 LAB — CBC
HCT: 42.1 % (ref 39.0–52.0)
Hemoglobin: 13.8 g/dL (ref 13.0–17.0)
MCH: 28 pg (ref 26.0–34.0)
MCHC: 32.8 g/dL (ref 30.0–36.0)
MCV: 85.6 fL (ref 80.0–100.0)
Platelets: 178 10*3/uL (ref 150–400)
RBC: 4.92 MIL/uL (ref 4.22–5.81)
RDW: 12.9 % (ref 11.5–15.5)
WBC: 9.4 10*3/uL (ref 4.0–10.5)
nRBC: 0 % (ref 0.0–0.2)

## 2019-05-28 LAB — COMPREHENSIVE METABOLIC PANEL
ALT: 12 U/L (ref 0–44)
AST: 19 U/L (ref 15–41)
Albumin: 4.4 g/dL (ref 3.5–5.0)
Alkaline Phosphatase: 66 U/L (ref 38–126)
Anion gap: 9 (ref 5–15)
BUN: 9 mg/dL (ref 6–20)
CO2: 23 mmol/L (ref 22–32)
Calcium: 9.4 mg/dL (ref 8.9–10.3)
Chloride: 106 mmol/L (ref 98–111)
Creatinine, Ser: 1.09 mg/dL (ref 0.61–1.24)
GFR calc Af Amer: >60 mL/min (ref >60)
GFR calc non Af Amer: >60 mL/min (ref >60)
Glucose, Bld: 89 mg/dL (ref 70–99)
Potassium: 3.8 mmol/L (ref 3.5–5.1)
Sodium: 138 mmol/L (ref 135–145)
Total Bilirubin: 0.9 mg/dL (ref 0.3–1.2)
Total Protein: 7.4 g/dL (ref 6.5–8.1)

## 2019-05-28 LAB — ETHANOL: Alcohol, Ethyl (B): <10 mg/dL (ref <10)

## 2019-05-28 LAB — SARS CORONAVIRUS 2 (TAT 6-24 HRS): SARS Coronavirus 2: NEGATIVE

## 2019-05-28 LAB — ACETAMINOPHEN LEVEL: Acetaminophen (Tylenol), Serum: <10 ug/mL — ABNORMAL LOW (ref 10–30)

## 2019-05-28 LAB — SALICYLATE LEVEL: Salicylate Lvl: <7 mg/dL (ref 2.8–30.0)

## 2019-05-28 MED ORDER — DIPHENHYDRAMINE HCL 50 MG/ML IJ SOLN: 50.0000 mg | Freq: Four times a day (QID) | INTRAMUSCULAR | Status: DC | PRN

## 2019-05-28 MED ORDER — HALOPERIDOL LACTATE 5 MG/ML IJ SOLN: 5.0000 mg | Freq: Four times a day (QID) | INTRAMUSCULAR | Status: DC | PRN

## 2019-05-28 MED ORDER — LORAZEPAM 2 MG/ML IJ SOLN: 2.0000 mg | Freq: Four times a day (QID) | INTRAMUSCULAR | Status: DC | PRN

## 2019-05-28 MED ORDER — DIPHENHYDRAMINE HCL 25 MG PO CAPS: 50.0000 mg | ORAL_CAPSULE | Freq: Four times a day (QID) | ORAL | Status: DC | PRN

## 2019-05-28 MED ORDER — LORAZEPAM 1 MG PO TABS: 2.0000 mg | ORAL_TABLET | Freq: Four times a day (QID) | ORAL | Status: DC | PRN

## 2019-05-28 NOTE — ED Notes (Signed)
No sitter available for pt per staffing

## 2019-05-28 NOTE — ED Notes (Signed)
No Sitter available for pt per Staffing Office - J Blue-Matthews, Agricultural consultant, aware. Pt being monitored by RN.

## 2019-05-28 NOTE — Progress Notes (Signed)
CSW was informed by Coralyn Mark w/ admissions at [facility] that pt would be presented for possible inpatient treatment. Coralyn Mark informed CSW that COVID results would need to be sent prior to admission if accepted.    Darletta Moll MSW, [place] Worker Disposition  [facility] Ph: [phone] Fax: [phone]

## 2019-05-28 NOTE — Consult Note (Signed)
Reviewed [facility] admission notes, and collateral information received from nursing notes, and IVC petition.  Patient with schizophrenia and bipolar 1 disorders, has not had antipsychotic medications in several months.  He demonstrates active psychosis, overt suicidal and aggressive behaviors as well as positive and negative symptoms of the disease process. Based on the above, he poses a high immediate safety risk, recommend inpatient hospitalizations to restart medications and stabilize symptoms.  This was reviewed with SW.

## 2019-05-28 NOTE — [facility] Counselor (Signed)
Patient's mother called with concerns that patient medications would not be re-started while he was in the ED. TTS explained to patient's mother the orders for his medication has been requested.

## 2019-05-28 NOTE — ED Notes (Signed)
Pt's mother called stating she is upset d/t wants pt to be placed back on psych meds then pt can return home - states she does not want him to be Inpt. Referred mother to speak w/[facility].

## 2019-05-28 NOTE — ED Notes (Signed)
Pharm to request Pharm Tech to perform Med Rec.

## 2019-05-28 NOTE — ED Notes (Signed)
Pt's mother, Juluis Rainier, called and advised pt has been off his meds and has not had Haldol injection in 2 months. States she feels if he can be given this and put back on his meds, "he will be fine". Advised mother pt is to be reassessed this am.
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
## 2019-05-28 NOTE — Progress Notes (Signed)
Patient meets criteria for inpatient treatment per Merlyn Lot, NP. No appropriate beds at [facility] currently. CSW faxed referrals to the following facilities for review:  [facility]   [facility]   [facility]   [facility]   [facility]    TSS will continue to seek bed placement.     Darletta Moll MSW, [place] Worker Disposition  [facility] Ph: [phone] Fax: [phone] 05/28/2019 10:04 AM

## 2019-05-28 NOTE — ED Notes (Addendum)
Pt needs EKG prior to restarting Haldol, per [facility] NP.

## 2019-05-28 NOTE — ED Notes (Signed)
Pt. Was sleeping soundly, his eyes were closed, respirations even and unlabored. He appeared to be no acute distress. I gently woke the Pt. Who responded to his name, looked up and then went back to sleep.

## 2019-05-28 NOTE — ED Notes (Addendum)
Pt arrived to [facility] with [employer], pt unwilling to answer any questions at this time. Pt only stating "I dont have seizures"  Pt silent when ask if we could call family to discuss care and history.

## 2019-05-28 NOTE — ED Notes (Signed)
Pt aware of need for urine specimen - refused to provide when Sitter and RN encouraged pt to please do so as he was ambulating to bathroom. Pt stood at bathroom door staring at wall. Slow to respond when asked pt to provide specimen - states "I just need to go to the bathroom but I don't need to pee". Pt refused to take urinal in bathroom w/him.

## 2019-05-29 MED ORDER — HALOPERIDOL DECANOATE 100 MG/ML IM SOLN
100.0000 mg | Freq: Once | INTRAMUSCULAR | Status: AC
  Administered 2019-05-30: 100 mg via INTRAMUSCULAR
  Filled 2019-05-29: qty 1

## 2019-05-29 MED ORDER — BENZTROPINE MESYLATE 1 MG PO TABS
1.0000 mg | ORAL_TABLET | Freq: Every day | ORAL | Status: DC
  Administered 2019-05-29 – 2019-05-30 (×2): 1 mg via ORAL
  Filled 2019-05-29 (×2): qty 1

## 2019-05-29 MED ORDER — HALOPERIDOL 5 MG PO TABS
10.0000 mg | ORAL_TABLET | Freq: Two times a day (BID) | ORAL | Status: DC
  Administered 2019-05-29 – 2019-05-30 (×3): 10 mg via ORAL
  Filled 2019-05-29 (×3): qty 2

## 2019-05-29 NOTE — BH Assessment (Addendum)
Per Jonelle Sidle, patient accepted to [facility] for admission 05/30/2019 after 9am. The accepting provider is Dr. Jonelle Sports. Nursing report is [phone]. Patient's nurse Beckie Busing) provided with disposition details.

## 2019-05-29 NOTE — Consult Note (Signed)
[facility] Psych ED Discharge  05/29/2019 5:54 PM Mario [name]  MRN:  [id number] Principal Problem: Psychosis Discharge Diagnoses: Active Problems:   Schizophrenia, paranoid type (HCC)   Subjective: "I'm ok."  Patient seen and evaluated in person of this provider.  He stopped taking his medication a month ago after his Haldol decanoate was due.  Agitation on admission with Haldol as needed given.  Patient slept and is calm and cooperative today, agreeable to start medications along with his long-term antipsychotic injectable.  His mother, his guardian, was contacted to verify his dose.  She would like to come and bring the patient home once he has his medications and stable.  Oral medications will be started today and injectable given tomorrow, if patient has no adverse effects he may discharge home to his mother.  Total Time spent with patient: 1 hour  Past Psychiatric History: schizophrenia  Past Medical History:  Past Medical History:  Diagnosis Date  . Bipolar 1 disorder (HCC)   . Schizophrenia (HCC)    History reviewed. No pertinent surgical history. Family History: No family history on file. Family Psychiatric  History: none Social History:  Social History   Substance and Sexual Activity  Alcohol Use No   Comment: occasionally     Social History   Substance and Sexual Activity  Drug Use Yes  . Types: Marijuana    Social History   Socioeconomic History  . Marital status: Single    Spouse name: Not on file  . Number of children: Not on file  . Years of education: Not on file  . Highest education level: Not on file  Occupational History  . Not on file  Social Needs  . Financial resource strain: Not on file  . Food insecurity    Worry: Not on file    Inability: Not on file  . Transportation needs    Medical: Not on file    Non-medical: Not on file  Tobacco Use  . Smoking status: Current Every Day Smoker    Packs/day: 0.50  . Smokeless tobacco: Never Used   Substance and Sexual Activity  . Alcohol use: No    Comment: occasionally  . Drug use: Yes    Types: Marijuana  . Sexual activity: Not on file  Lifestyle  . Physical activity    Days per week: Not on file    Minutes per session: Not on file  . Stress: Not on file  Relationships  . Social Musician on phone: Not on file    Gets together: Not on file    Attends religious service: Not on file    Active member of club or organization: Not on file    Attends meetings of clubs or organizations: Not on file    Relationship status: Not on file  Other Topics Concern  . Not on file  Social History Narrative  . Not on file    Has this patient used any form of tobacco in the last 30 days? (Cigarettes, Smokeless Tobacco, Cigars, and/or Pipes) A prescription for an FDA-approved tobacco cessation medication was offered at discharge and the patient refused  Current Medications: Current Facility-Administered Medications  Medication Dose Route Frequency Provider Last Rate Last Dose  . benztropine (COGENTIN) tablet 1 mg  1 mg Oral Daily Charm Rings, NP   1 mg at 05/29/19 1152  . diphenhydrAMINE (BENADRYL) capsule 50 mg  50 mg Oral Q6H PRN Chales Abrahams, NP  Or  . diphenhydrAMINE (BENADRYL) injection 50 mg  50 mg Intramuscular Q6H PRN Ophelia ShoulderMills, Shnese E, NP      . haloperidol (HALDOL) tablet 10 mg  10 mg Oral BID Charm RingsLord, Kyel Purk Y, NP   10 mg at 05/29/19 1152  . [START ON 05/30/2019] haloperidol decanoate (HALDOL DECANOATE) 100 MG/ML injection 100 mg  100 mg Intramuscular Once Charm RingsLord, Cameren Earnest Y, NP      . haloperidol lactate (HALDOL) injection 5 mg  5 mg Intramuscular Q6H PRN Ophelia ShoulderMills, Shnese E, NP      . LORazepam (ATIVAN) tablet 2 mg  2 mg Oral Q6H PRN Chales AbrahamsMills, Shnese E, NP       Or  . LORazepam (ATIVAN) injection 2 mg  2 mg Intramuscular Q6H PRN Chales AbrahamsMills, Shnese E, NP       No current outpatient medications on file.   PTA Medications: (Not in a hospital  admission)   Musculoskeletal: Strength & Muscle Tone: within normal limits Gait & Station: normal Patient leans: N/A  Psychiatric Specialty Exam: Physical Exam  Nursing note and vitals reviewed. Constitutional: He is oriented to person, place, and time. He appears well-developed and well-nourished.  HENT:  Head: Normocephalic.  Neck: Normal range of motion.  Respiratory: Effort normal.  Musculoskeletal: Normal range of motion.  Neurological: He is alert and oriented to person, place, and time.  Psychiatric: His speech is normal and behavior is normal. Judgment and thought content normal. His mood appears anxious. His affect is blunt. Cognition and memory are normal.    Review of Systems  Psychiatric/Behavioral: The patient is nervous/anxious.   All other systems reviewed and are negative.   Blood pressure 128/79, pulse 90, temperature 98.6 F (37 C), temperature source Oral, resp. rate 20, SpO2 96 %.There is no height or weight on file to calculate BMI.  General Appearance: Casual  Eye Contact:  Fair  Speech:  Normal Rate  Volume:  Normal  Mood:  Anxious  Affect:  Blunt  Thought Process:  Coherent  Orientation:  Full (Time, Place, and Person)  Thought Content:  WDL and Logical  Suicidal Thoughts:  No  Homicidal Thoughts:  No  Memory:  Immediate;   Good Recent;   Good Remote;   Good  Judgement:  Fair  Insight:  Fair  Psychomotor Activity:  Decreased  Concentration:  Concentration: Good and Attention Span: Good  Recall:  Good  Fund of Knowledge:  Good  Language:  Good  Akathisia:  No  Handed:  Right  AIMS (if indicated):     Assets:  Housing Leisure Time Physical Health Resilience Social Support  ADL's:  Intact  Cognition:  WNL  Sleep:        Demographic Factors:  Male  Loss Factors: NA  Historical Factors: NA  Risk Reduction Factors:   Sense of responsibility to family, Living with another person, especially a relative, Positive social support and  Positive therapeutic relationship  Continued Clinical Symptoms:  Anxiety  Cognitive Features That Contribute To Risk:  None    Suicide Risk:  Minimal: No identifiable suicidal ideation.  Patients presenting with no risk factors but with morbid ruminations; may be classified as minimal risk based on the severity of the depressive symptoms   Plan Of Care/Follow-up recommendations:  Schizophrenia, paranoid type: -Started Haldol 10 mg BID  -Start Haldol decanoate 100 mg in the am once  EPS: -Start Cogentin 1 mg daily Activity:  as tolerated Diet:  heart healthy diet  Disposition: discharge to his mother in  the am Waylan Boga, NP 05/29/2019, 5:54 PM

## 2019-05-29 NOTE — ED Notes (Signed)
IVC-Inpt  

## 2019-05-29 NOTE — ED Notes (Signed)
Breakfast Ordered 

## 2019-05-30 NOTE — ED Provider Notes (Signed)
Pt accepted to [facility] for inpatient treatment, Dr. Selinda Flavin accepting.   Little, Wenda Overland, MD 05/30/19 [phone]

## 2019-05-30 NOTE — ED Notes (Signed)
Deputy advised he is en route to pick up pt.

## 2019-05-30 NOTE — ED Notes (Signed)
Breakfast tray ordered 

## 2019-05-30 NOTE — ED Notes (Signed)
ALL belongings - 1 labeled belongings bag and 1 valuables envelope - Deputy - Pt aware. Pt being transported to [facility].

## 2019-08-07 ENCOUNTER — Emergency Department (HOSPITAL_COMMUNITY): Payer: No Typology Code available for payment source

## 2019-08-07 ENCOUNTER — Encounter (HOSPITAL_COMMUNITY): Payer: Self-pay

## 2019-08-07 ENCOUNTER — Emergency Department (HOSPITAL_COMMUNITY)
Admission: EM | Admit: 2019-08-07 | Discharge: 2019-09-04 | Disposition: E | Payer: No Typology Code available for payment source | Attending: Emergency Medicine | Admitting: Emergency Medicine

## 2019-08-07 DIAGNOSIS — T1490XA Injury, unspecified, initial encounter: Secondary | ICD-10-CM

## 2019-08-07 DIAGNOSIS — Y939 Activity, unspecified: Secondary | ICD-10-CM | POA: Insufficient documentation

## 2019-08-07 DIAGNOSIS — Y929 Unspecified place or not applicable: Secondary | ICD-10-CM | POA: Diagnosis not present

## 2019-08-07 DIAGNOSIS — T07XXXA Unspecified multiple injuries, initial encounter: Secondary | ICD-10-CM | POA: Diagnosis present

## 2019-08-07 DIAGNOSIS — I468 Cardiac arrest due to other underlying condition: Secondary | ICD-10-CM | POA: Insufficient documentation

## 2019-08-07 DIAGNOSIS — Y999 Unspecified external cause status: Secondary | ICD-10-CM | POA: Insufficient documentation

## 2019-08-07 HISTORY — DX: Bipolar disorder, unspecified: F31.9

## 2019-08-07 HISTORY — DX: Brief psychotic disorder: F23

## 2019-08-07 LAB — COMPREHENSIVE METABOLIC PANEL
ALT: 53 U/L — ABNORMAL HIGH (ref 0–44)
AST: 75 U/L — ABNORMAL HIGH (ref 15–41)
Albumin: 2.9 g/dL — ABNORMAL LOW (ref 3.5–5.0)
Alkaline Phosphatase: 44 U/L (ref 38–126)
Anion gap: 18 — ABNORMAL HIGH (ref 5–15)
BUN: 10 mg/dL (ref 6–20)
CO2: 16 mmol/L — ABNORMAL LOW (ref 22–32)
Calcium: 8.2 mg/dL — ABNORMAL LOW (ref 8.9–10.3)
Chloride: 107 mmol/L (ref 98–111)
Creatinine, Ser: 1.76 mg/dL — ABNORMAL HIGH (ref 0.61–1.24)
GFR calc Af Amer: >60 mL/min (ref >60)
GFR calc non Af Amer: 52 mL/min — ABNORMAL LOW (ref >60)
Glucose, Bld: 311 mg/dL — ABNORMAL HIGH (ref 70–99)
Potassium: 3.7 mmol/L (ref 3.5–5.1)
Sodium: 141 mmol/L (ref 135–145)
Total Bilirubin: 0.4 mg/dL (ref 0.3–1.2)
Total Protein: 5.2 g/dL — ABNORMAL LOW (ref 6.5–8.1)

## 2019-08-07 LAB — I-STAT CHEM 8, ED
BUN: 10 mg/dL (ref 6–20)
Calcium, Ion: 1.08 mmol/L — ABNORMAL LOW (ref 1.15–1.40)
Chloride: 105 mmol/L (ref 98–111)
Creatinine, Ser: 1.5 mg/dL — ABNORMAL HIGH (ref 0.61–1.24)
Glucose, Bld: 284 mg/dL — ABNORMAL HIGH (ref 70–99)
HCT: 30 % — ABNORMAL LOW (ref 39.0–52.0)
Hemoglobin: 10.2 g/dL — ABNORMAL LOW (ref 13.0–17.0)
Potassium: 3.6 mmol/L (ref 3.5–5.1)
Sodium: 139 mmol/L (ref 135–145)
TCO2: 19 mmol/L — ABNORMAL LOW (ref 22–32)

## 2019-08-07 LAB — PREPARE FRESH FROZEN PLASMA
Unit division: 0
Unit division: 0

## 2019-08-07 LAB — LACTIC ACID, PLASMA: Lactic Acid, Venous: 9.2 mmol/L (ref 0.5–1.9)

## 2019-08-07 LAB — BPAM FFP
Blood Product Expiration Date: 202102012359
Blood Product Expiration Date: 202102012359
ISSUE DATE / TIME: 202102012113
ISSUE DATE / TIME: 202102012113
Unit Type and Rh: 600
Unit Type and Rh: 6200

## 2019-08-07 LAB — PROTIME-INR
INR: 3.4 — ABNORMAL HIGH (ref 0.8–1.2)
Prothrombin Time: 34.1 seconds — ABNORMAL HIGH (ref 11.4–15.2)

## 2019-08-07 LAB — CDS SEROLOGY

## 2019-08-07 LAB — CBC
HCT: 31.7 % — ABNORMAL LOW (ref 39.0–52.0)
Hemoglobin: 9.2 g/dL — ABNORMAL LOW (ref 13.0–17.0)
MCH: 27.6 pg (ref 26.0–34.0)
MCHC: 29 g/dL — ABNORMAL LOW (ref 30.0–36.0)
MCV: 95.2 fL (ref 80.0–100.0)
Platelets: DECREASED 10*3/uL (ref 150–400)
RBC: 3.33 MIL/uL — ABNORMAL LOW (ref 4.22–5.81)
RDW: 13.2 % (ref 11.5–15.5)
WBC: 6.4 10*3/uL (ref 4.0–10.5)
nRBC: 0.5 % — ABNORMAL HIGH (ref 0.0–0.2)

## 2019-08-07 LAB — ABO/RH: ABO/RH(D): B POS

## 2019-08-07 LAB — ETHANOL: Alcohol, Ethyl (B): <10 mg/dL (ref <10)

## 2019-08-07 MED ORDER — EPINEPHRINE 1 MG/10ML IJ SOSY
PREFILLED_SYRINGE | INTRAMUSCULAR | Status: AC | PRN
  Administered 2019-08-07 (×4): 1 via INTRAVENOUS

## 2019-08-08 ENCOUNTER — Encounter (HOSPITAL_COMMUNITY): Payer: Self-pay

## 2019-08-08 LAB — BPAM RBC
Blood Product Expiration Date: 202102262359
Blood Product Expiration Date: 202102262359
ISSUE DATE / TIME: 202102012113
ISSUE DATE / TIME: 202102012117
Unit Type and Rh: 5100
Unit Type and Rh: 5100

## 2019-08-08 LAB — TYPE AND SCREEN
ABO/RH(D): B POS
Antibody Screen: NEGATIVE
Unit division: 0
Unit division: 0

## 2019-08-08 MED FILL — Medication: Qty: 1 | Status: AC
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
## 2019-09-04 NOTE — ED Notes (Signed)
Pt arrived EMS w/ Epi drip

## 2019-09-04 NOTE — ED Notes (Signed)
Cordis placed by [place], MD left fem artery

## 2019-09-04 NOTE — ED Notes (Signed)
Patient remains in PEA

## 2019-09-04 NOTE — ED Notes (Signed)
CSI at bedside.

## 2019-09-04 NOTE — ED Notes (Signed)
[phone] Mario [name]   Please call

## 2019-09-04 NOTE — ED Notes (Signed)
Thoracotomy by Erin Hearing
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
## 2019-09-04 NOTE — ED Notes (Signed)
Patient remains pulseless; CPR resumed
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
## 2019-09-04 NOTE — ED Notes (Signed)
I/O implanted in left tib-fib

## 2019-09-04 NOTE — ED Notes (Addendum)
Pulse check/cardiac activity check: Per [place], no pulse. [place], Trauma surgeon at bedside - no pulse. Time of death 21:35

## 2019-09-04 NOTE — ED Notes (Signed)
Pt in PEA - no pulse. CPR continued

## 2019-09-04 DEATH — deceased
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
